# Patient Record
Sex: Male | Born: 1975 | Race: Black or African American | Hispanic: No | Marital: Married | Smoking: Current some day smoker
Health system: Southern US, Community
[De-identification: ages and names within clinical notes are randomized; demographics above are authoritative.]

## PROBLEM LIST (undated history)

## (undated) DIAGNOSIS — G43909 Migraine, unspecified, not intractable, without status migrainosus: Secondary | ICD-10-CM

## (undated) DIAGNOSIS — I10 Essential (primary) hypertension: Secondary | ICD-10-CM

## (undated) HISTORY — PX: SURGERY SCROTAL / TESTICULAR: SUR1316

## (undated) HISTORY — PX: TONSILLECTOMY: SUR1361

## (undated) HISTORY — DX: Migraine, unspecified, not intractable, without status migrainosus: G43.909

## (undated) HISTORY — PX: CHOLECYSTECTOMY: SHX55

## (undated) HISTORY — DX: Essential (primary) hypertension: I10

## (undated) HISTORY — PX: WISDOM TOOTH EXTRACTION: SHX21

---

## 2017-01-13 DIAGNOSIS — I10 Essential (primary) hypertension: Secondary | ICD-10-CM | POA: Diagnosis not present

## 2017-01-13 DIAGNOSIS — G43009 Migraine without aura, not intractable, without status migrainosus: Secondary | ICD-10-CM | POA: Diagnosis not present

## 2017-01-13 DIAGNOSIS — Z6841 Body Mass Index (BMI) 40.0 and over, adult: Secondary | ICD-10-CM | POA: Diagnosis not present

## 2017-01-13 DIAGNOSIS — E782 Mixed hyperlipidemia: Secondary | ICD-10-CM | POA: Diagnosis not present

## 2017-01-13 DIAGNOSIS — Z0000 Encounter for general adult medical examination without abnormal findings: Secondary | ICD-10-CM | POA: Diagnosis not present

## 2017-02-16 ENCOUNTER — Ambulatory Visit: Payer: Self-pay

## 2017-02-16 ENCOUNTER — Other Ambulatory Visit: Payer: Self-pay | Admitting: Podiatry

## 2017-02-16 ENCOUNTER — Ambulatory Visit: Payer: Self-pay | Admitting: Podiatry

## 2017-02-16 ENCOUNTER — Ambulatory Visit (INDEPENDENT_AMBULATORY_CARE_PROVIDER_SITE_OTHER): Payer: Self-pay

## 2017-02-16 VITALS — BP 144/97 | HR 89

## 2017-02-16 DIAGNOSIS — M7751 Other enthesopathy of right foot: Secondary | ICD-10-CM

## 2017-02-16 DIAGNOSIS — R52 Pain, unspecified: Secondary | ICD-10-CM

## 2017-02-16 DIAGNOSIS — M779 Enthesopathy, unspecified: Secondary | ICD-10-CM

## 2017-02-16 DIAGNOSIS — B353 Tinea pedis: Secondary | ICD-10-CM

## 2017-02-16 MED ORDER — KETOCONAZOLE 2 % EX CREA: TOPICAL_CREAM | CUTANEOUS | 0 refills | Status: DC

## 2017-02-16 NOTE — Progress Notes (Signed)
   Subjective:    Patient ID: Marc BoatmanKevin Zeiner, male    DOB: November 22, 1975, 42 y.o.   MRN: [id number]  HPI  Chief Complaint  Patient presents with  . Foot Pain    Right, 3rd 4th and 5th toes        Review of Systems  All other systems reviewed and are negative.      Objective:   Physical Exam        Assessment & Plan:

## 2017-07-10 NOTE — Progress Notes (Signed)
HPI:  Using dictation device. Unfortunately this device frequently misinterprets words/phrases.  Here to establish care.  HTN: -meds: lisinopril-hctz  -does not take meds, daily, did not take today  Morbid obesity: -poor diet, not exercise -prior PCP rxd topamax for this and migraine s- helps some with weight  Migraines: -several per week -improved on topamax, but not great -trigger is stress  Erectile dysfunction: -sometimes, under a lot of stress -no exercise, poor diet  ROS: See pertinent positives and negatives per HPI.  Past Medical History:  Diagnosis Date  . Hypertension   . Migraine     Past Surgical History:  Procedure Laterality Date  . CHOLECYSTECTOMY    . SURGERY SCROTAL / TESTICULAR     age 42 per patient  . TONSILLECTOMY      History reviewed. No pertinent family history.  SOCIAL HX: see hpi occ cigar Social alcohole - small amount a few times per month   Current Outpatient Medications:  .  ketoconazole (NIZORAL) 2 % cream, Apply 1 fingertip amount to each foot daily., Disp: 30 g, Rfl: 0 .  lisinopril-hydrochlorothiazide (PRINZIDE,ZESTORETIC) 20-12.5 MG tablet, Take 1 tablet by mouth daily., Disp: 90 tablet, Rfl: 1 .  topiramate (TOPAMAX) 25 MG tablet, Take 1 tablet (25 mg total) by mouth daily., Disp: 90 tablet, Rfl: 1  EXAM:  Vitals:   07/11/17 0753  BP: 130/90  Pulse: 88  Temp: 98.9 F (37.2 C)    Body mass index is 43 kg/m.  GENERAL: vitals reviewed and listed above, alert, oriented, appears well hydrated and in no acute distress  HEENT: atraumatic, conjunttiva clear, no obvious abnormalities on inspection of external nose and ears  NECK: no obvious masses on inspection  LUNGS: clear to auscultation bilaterally, no wheezes, rales or rhonchi, good air movement  CV: HRRR, no peripheral edema  MS: moves all extremities without noticeable abnormality  PSYCH: pleasant and cooperative, no obvious depression or  anxiety  ASSESSMENT AND PLAN:  Discussed the following assessment and plan:  Essential hypertension - Plan: Basic metabolic panel, CBC  Other migraine without status migrainosus, not intractable - Plan: Ambulatory referral to Neurology  Erectile dysfunction, unspecified erectile dysfunction type  Morbid obesity (HCC) - Plan: HDL cholesterol, Cholesterol, total, Hemoglobin A1c  Encounter to establish care with new doctor  Screening for HIV (human immunodeficiency virus) - Plan: HIV antibody  -BP a little better on recheck, did not take medication today -refills sent -discussed migraines and opted for referral to neurology  -discussed tx options for ED - opted to start with healthier lifestyle and stress management, consider meds -advised healthy diet, regular exercise and wt reduction - see pt instructions -labs per orders -he wanted tetanus booster today -advised 3 month follow up -Patient advised to return or notify a doctor immediately if symptoms worsen or persist or new concerns arise.  Patient Instructions  BEFORE YOU LEAVE: -tetanus booster -labs -follow up: 3 months  -we sent referrals of your medications. Please get a pill box and take medications daily.  -please limit the ibuprofen to no more then 2 x per week.  -consider counseling for stress and acupuncture for migraines  -eat a healthy low sugar diet and get at least 150 minutes of aerobic exercise per week (see details below for a healthy lifestyle).  -We placed a referral for you as discussed to neurology regarding the migraines. It usually takes about 1-2 weeks to process and schedule this referral. If you have not heard from us regarding this  appointment in 2 weeks please contact our office.   We recommend the following healthy lifestyle for LIFE: 1) Small portions. But, make sure to get regular (at least 3 per day), healthy meals and small healthy snacks if needed.  2) Eat a healthy clean diet.   TRY  TO EAT: -at least 5-7 servings of low sugar, colorful, and nutrient rich vegetables per day (not corn, potatoes or bananas.) -berries are the best choice if you wish to eat fruit (only eat small amounts if trying to reduce weight)  -lean meets (fish, white meat of chicken or [place]) -vegan proteins for some meals - beans or tofu, whole grains, nuts and seeds -Replace bad fats with good fats - good fats include: fish, nuts and seeds, canola oil, olive oil -small amounts of low fat or non fat dairy -small amounts of100 % whole grains - check the lables -drink plenty of water  AVOID: -SUGAR, sweets, anything with added sugar, corn syrup or sweeteners - must read labels as even foods advertised as "healthy" often are loaded with sugar -if you must have a sweetener, small amounts of stevia may be best -sweetened beverages and artificially sweetened beverages -simple starches (rice, bread, potatoes, pasta, chips, etc - small amounts of 100% whole grains are ok) -red meat, pork, butter -fried foods, fast food, processed food, excessive dairy, eggs and coconut.  3)Get at least 150 minutes of sweaty aerobic exercise per week.  4)Reduce stress - consider counseling, meditation and relaxation to balance other aspects of your life.    Terressa Koyanagi, DO

## 2017-07-11 ENCOUNTER — Encounter: Payer: Self-pay | Admitting: Family Medicine

## 2017-07-11 ENCOUNTER — Ambulatory Visit: Payer: 59 | Admitting: Family Medicine

## 2017-07-11 ENCOUNTER — Encounter: Payer: Self-pay | Admitting: Neurology

## 2017-07-11 VITALS — BP 130/90 | HR 88 | Temp 98.9°F | Ht 68.0 in | Wt 282.8 lb

## 2017-07-11 DIAGNOSIS — Z114 Encounter for screening for human immunodeficiency virus [HIV]: Secondary | ICD-10-CM

## 2017-07-11 DIAGNOSIS — I10 Essential (primary) hypertension: Secondary | ICD-10-CM | POA: Diagnosis not present

## 2017-07-11 DIAGNOSIS — Z23 Encounter for immunization: Secondary | ICD-10-CM | POA: Diagnosis not present

## 2017-07-11 DIAGNOSIS — N529 Male erectile dysfunction, unspecified: Secondary | ICD-10-CM | POA: Diagnosis not present

## 2017-07-11 DIAGNOSIS — G43809 Other migraine, not intractable, without status migrainosus: Secondary | ICD-10-CM

## 2017-07-11 DIAGNOSIS — G43909 Migraine, unspecified, not intractable, without status migrainosus: Secondary | ICD-10-CM | POA: Insufficient documentation

## 2017-07-11 DIAGNOSIS — Z7689 Persons encountering health services in other specified circumstances: Secondary | ICD-10-CM

## 2017-07-11 LAB — CBC
HEMATOCRIT: 39.6 % (ref 39.0–52.0)
HEMOGLOBIN: 13.4 g/dL (ref 13.0–17.0)
MCHC: 33.7 g/dL (ref 30.0–36.0)
MCV: 91.6 fl (ref 78.0–100.0)
PLATELETS: 307 10*3/uL (ref 150.0–400.0)
RBC: 4.32 Mil/uL (ref 4.22–5.81)
RDW: 13.6 % (ref 11.5–15.5)
WBC: 6.8 10*3/uL (ref 4.0–10.5)

## 2017-07-11 LAB — BASIC METABOLIC PANEL
BUN: 20 mg/dL (ref 6–23)
CHLORIDE: 104 meq/L (ref 96–112)
CO2: 28 meq/L (ref 19–32)
Calcium: 9.7 mg/dL (ref 8.4–10.5)
Creatinine, Ser: 1.07 mg/dL (ref 0.40–1.50)
GFR: 97.38 mL/min (ref >60.00)
Glucose, Bld: 98 mg/dL (ref 70–99)
POTASSIUM: 4.7 meq/L (ref 3.5–5.1)
SODIUM: 139 meq/L (ref 135–145)

## 2017-07-11 LAB — HEMOGLOBIN A1C: HEMOGLOBIN A1C: 5.2 % (ref 4.6–6.5)

## 2017-07-11 LAB — CHOLESTEROL, TOTAL: Cholesterol: 234 mg/dL — ABNORMAL HIGH (ref 0–200)

## 2017-07-11 LAB — HDL CHOLESTEROL: HDL: 37.7 mg/dL — ABNORMAL LOW (ref >39.00)

## 2017-07-11 MED ORDER — LISINOPRIL-HYDROCHLOROTHIAZIDE 20-12.5 MG PO TABS: 1.0000 | ORAL_TABLET | Freq: Every day | ORAL | 1 refills | Status: DC

## 2017-07-11 MED ORDER — TOPIRAMATE 25 MG PO TABS: 25.0000 mg | ORAL_TABLET | Freq: Every day | ORAL | 1 refills | Status: DC

## 2017-07-11 NOTE — Patient Instructions (Signed)
BEFORE YOU LEAVE: -tetanus booster -labs -follow up: 3 months  -we sent referrals of your medications. Please get a pill box and take medications daily.  -please limit the ibuprofen to no more then 2 x per week.  -consider counseling for stress and acupuncture for migraines  -eat a healthy low sugar diet and get at least 150 minutes of aerobic exercise per week (see details below for a healthy lifestyle).  -We placed a referral for you as discussed to neurology regarding the migraines. It usually takes about 1-2 weeks to process and schedule this referral. If you have not heard from us regarding this appointment in 2 weeks please contact our office.   We recommend the following healthy lifestyle for LIFE: 1) Small portions. But, make sure to get regular (at least 3 per day), healthy meals and small healthy snacks if needed.  2) Eat a healthy clean diet.   TRY TO EAT: -at least 5-7 servings of low sugar, colorful, and nutrient rich vegetables per day (not corn, potatoes or bananas.) -berries are the best choice if you wish to eat fruit (only eat small amounts if trying to reduce weight)  -lean meets (fish, white meat of chicken or [place]) -vegan proteins for some meals - beans or tofu, whole grains, nuts and seeds -Replace bad fats with good fats - good fats include: fish, nuts and seeds, canola oil, olive oil -small amounts of low fat or non fat dairy -small amounts of100 % whole grains - check the lables -drink plenty of water  AVOID: -SUGAR, sweets, anything with added sugar, corn syrup or sweeteners - must read labels as even foods advertised as "healthy" often are loaded with sugar -if you must have a sweetener, small amounts of stevia may be best -sweetened beverages and artificially sweetened beverages -simple starches (rice, bread, potatoes, pasta, chips, etc - small amounts of 100% whole grains are ok) -red meat, pork, butter -fried foods, fast food, processed food,  excessive dairy, eggs and coconut.  3)Get at least 150 minutes of sweaty aerobic exercise per week.  4)Reduce stress - consider counseling, meditation and relaxation to balance other aspects of your life.

## 2017-07-11 NOTE — Addendum Note (Signed)
Addended by: Johnella MoloneyFUNDERBURK, JO A on: 07/11/2017 08:43 AM   Modules accepted: Orders

## 2017-07-12 LAB — HIV ANTIBODY (ROUTINE TESTING W REFLEX): HIV: NONREACTIVE

## 2017-07-17 ENCOUNTER — Encounter: Payer: Self-pay | Admitting: Family Medicine

## 2017-07-17 DIAGNOSIS — E785 Hyperlipidemia, unspecified: Secondary | ICD-10-CM | POA: Insufficient documentation

## 2017-09-13 NOTE — Progress Notes (Signed)
NEUROLOGY CONSULTATION NOTE  Marc Bradley MRN: [id number] DOB: Oct 20, 1975  Referring provider: Kriste Basque, DO Primary care provider: Kriste Basque, DO  Reason for consult:  migraines  HISTORY OF PRESENT ILLNESS: Marc Bradley is a 42 year old right-handed male with hypertension and migraines who presents for migraines.  History supplemented by referring provider's note.  Onset:  87 or 42 years old Location:  Crown/top of head Quality:  Pounding/squeezing/throbbing Intensity:  Severe.  He denies new headache, thunderclap headache or severe headache that wakes him from sleep. Aura:  He sees stars with the headache Prodrome:  no Postdrome:  no Associated symptoms:  Photophobia, phonophobia, osmophobia, sometimes nausea.  He denies associated unilateral numbness or weakness. Duration:  2 to 3 hours Frequency:  2 to 3 days a week Frequency of abortive medication: Takes Motrin 2 to 3 days a week Triggers:  No Exacerbating factors:  No Relieving factors:  Dark quiet room Activity:  aggravates  Current NSAIDS:  Motrin 800mg  Current analgesics:  no Current triptans:  no Current ergotamine:  no Current anti-emetic:  no Current muscle relaxants:  no Current anti-anxiolytic:  no Current sleep aide:  no Current Antihypertensive medications:  Lisinopril-HCTZ Current Antidepressant medications:  no Current Anticonvulsant medications:  topiramate 50mg  twice daily Current anti-CGRP:  no Current Vitamins/Herbal/Supplements:  no Current Antihistamines/Decongestants:  no Other therapy:   no  Past NSAIDS:  No Past analgesics:  Tylenol (ineffective), Excedrin (ineffective) Past abortive triptans:  no Past abortive ergotamine:  no Past muscle relaxants:  no Past anti-emetic:  no Past antihypertensive medications:  no Past antidepressant medications:  no Past anticonvulsant medications:  no Past anti-CGRP:  no Past vitamins/Herbal/Supplements:  no Past  antihistamines/decongestants:  no Other past therapies:  no  Caffeine:  2-3 sodas daily Alcohol:  social Diet:  Drinks 48 oz a day or more of water Exercise:  Not routine Depression:  no; Anxiety:  sometimes Other pain:  no Sleep hygiene: good Family history of headache: Sister may have migraines  07/11/17 LABS:  CBC with WBC 6.8, HGB 13.4, HCT 39.6, PLT 307; BMP with Na 139, K 4.7, Cl 104, CO2 28, glucose 98, BUN 20, Cr 1.07.  PAST MEDICAL HISTORY: Past Medical History:  Diagnosis Date  . Hypertension   . Migraine     PAST SURGICAL HISTORY: Past Surgical History:  Procedure Laterality Date  . CHOLECYSTECTOMY    . SURGERY SCROTAL / TESTICULAR     age 36 per patient  . TONSILLECTOMY      MEDICATIONS: Current Outpatient Medications on File Prior to Visit  Medication Sig Dispense Refill  . ketoconazole (NIZORAL) 2 % cream Apply 1 fingertip amount to each foot daily. 30 g 0  . lisinopril-hydrochlorothiazide (PRINZIDE,ZESTORETIC) 20-12.5 MG tablet Take 1 tablet by mouth daily. 90 tablet 1  . topiramate (TOPAMAX) 25 MG tablet Take 1 tablet (25 mg total) by mouth daily. 90 tablet 1   No current facility-administered medications on file prior to visit.     ALLERGIES: No Known Allergies  FAMILY HISTORY: No family history on file.  SOCIAL HISTORY: Social History   Socioeconomic History  . Marital status: Married    Spouse name: Not on file  . Number of children: Not on file  . Years of education: Not on file  . Highest education level: Not on file  Occupational History  . Not on file  Social Needs  . Financial resource strain: Not on file  . Food insecurity:    Worry:  Not on file    Inability: Not on file  . Transportation needs:    Medical: Not on file    Non-medical: Not on file  Tobacco Use  . Smoking status: Current Some Day Smoker    Types: Cigars  . Smokeless tobacco: Former Engineer, water and Sexual Activity  . Alcohol use: Yes    Comment: social  drinker  . Drug use: Never  . Sexual activity: Not on file  Lifestyle  . Physical activity:    Days per week: Not on file    Minutes per session: Not on file  . Stress: Not on file  Relationships  . Social connections:    Talks on phone: Not on file    Gets together: Not on file    Attends religious service: Not on file    Active member of club or organization: Not on file    Attends meetings of clubs or organizations: Not on file    Relationship status: Not on file  . Intimate partner violence:    Fear of current or ex partner: Not on file    Emotionally abused: Not on file    Physically abused: Not on file    Forced sexual activity: Not on file  Other Topics Concern  . Not on file  Social History Narrative  . Not on file    REVIEW OF SYSTEMS: Constitutional: No fevers, chills, or sweats, no generalized fatigue, change in appetite Eyes: No visual changes, double vision, eye pain Ear, nose and throat: No hearing loss, ear pain, nasal congestion, sore throat Cardiovascular: No chest pain, palpitations Respiratory:  No shortness of breath at rest or with exertion, wheezes GastrointestinaI: No nausea, vomiting, diarrhea, abdominal pain, fecal incontinence Genitourinary:  No dysuria, urinary retention or frequency Musculoskeletal:  No neck pain, back pain Integumentary: No rash, pruritus, skin lesions Neurological: as above Psychiatric: No depression, insomnia, anxiety Endocrine: No palpitations, fatigue, diaphoresis, mood swings, change in appetite, change in weight, increased thirst Hematologic/Lymphatic:  No purpura, petechiae. Allergic/Immunologic: no itchy/runny eyes, nasal congestion, recent allergic reactions, rashes  PHYSICAL EXAM: Blood pressure (!) 164/110, pulse 86, height 5\' 7"  (1.702 m), weight 286 lb (129.7 kg), SpO2 98 %. General: No acute distress.  Patient appears well-groomed.   Head:  Normocephalic/atraumatic Eyes:  fundi examined but not  visualized Neck: supple, no paraspinal tenderness, full range of motion Back: No paraspinal tenderness Heart: regular rate and rhythm Lungs: Clear to auscultation bilaterally. Vascular: No carotid bruits. Neurological Exam: Mental status: alert and oriented to person, place, and time, recent and remote memory intact, fund of knowledge intact, attention and concentration intact, speech fluent and not dysarthric, language intact. Cranial nerves: CN I: not tested CN II: pupils equal, round and reactive to light, visual fields intact CN III, IV, VI:  full range of motion, no nystagmus, no ptosis CN V: facial sensation intact CN VII: upper and lower face symmetric CN VIII: hearing intact CN IX, X: gag intact, uvula midline CN XI: sternocleidomastoid and trapezius muscles intact CN XII: tongue midline Bulk & Tone: normal, no fasciculations. Motor:  5/5 throughout  Sensation:  temperature and vibration sensation intact.   Deep Tendon Reflexes:  2+ throughout, toes downgoing.  Finger to nose testing:  Without dysmetria.   Heel to shin:  Without dysmetria.   Gait:  Normal station and stride.  Able to turn and tandem walk. Romberg negative.  IMPRESSION: Migraine with aura, without status migrainosus, not intractable Hypertension:  Elevated today.  He did not take his medication this morning  PLAN: 1.  For abortive therapy, will start sumatriptan 100mg  2.  Increase topiramate to 75mg  twice daily.  If migraines not improved in 6 weeks, he is to contact me and we can increase dose to 100mg  twice daily 3.  Limit use of pain relievers to no more than 2 days out of week to prevent risk of rebound or medication-overuse headache. 4.  Keep headache diary 5.  Advised to go home ant take blood pressure medication.  Recheck blood pressure later today and if still elevated should contact Dr. Selena Batten. 6.  Follow up in 4 months.  Thank you for allowing me to take part in the care of this patient.  Shon Millet, DO  CC: Kriste Basque, DO

## 2017-09-14 ENCOUNTER — Encounter: Payer: Self-pay | Admitting: Neurology

## 2017-09-14 ENCOUNTER — Ambulatory Visit: Payer: 59 | Admitting: Neurology

## 2017-09-14 VITALS — BP 164/110 | HR 86 | Ht 67.0 in | Wt 286.0 lb

## 2017-09-14 DIAGNOSIS — G43109 Migraine with aura, not intractable, without status migrainosus: Secondary | ICD-10-CM

## 2017-09-14 DIAGNOSIS — I10 Essential (primary) hypertension: Secondary | ICD-10-CM

## 2017-09-14 MED ORDER — SUMATRIPTAN SUCCINATE 100 MG PO TABS: ORAL_TABLET | ORAL | 3 refills | Status: DC

## 2017-09-14 MED ORDER — TOPIRAMATE 50 MG PO TABS: 75.0000 mg | ORAL_TABLET | Freq: Two times a day (BID) | ORAL | 3 refills | Status: DC

## 2017-09-14 NOTE — Patient Instructions (Signed)
Migraine Recommendations: 1.  I will prescribe topiramate 50mg  tablet.  Take 1 1/2 tablets twice daily.  Contact me in 6 weeks with update and we can adjust dose if needed. 2.  Take sumatriptan 100mg  at earliest onset of headache.  May repeat dose once in 2 hours if needed.  Do not exceed two tablets in 24 hours. 3.  Limit use of pain relievers to no more than 2 days out of the week.  These medications include acetaminophen, ibuprofen, triptans and narcotics.  This will help reduce risk of rebound headaches. 4.  Be aware of common food triggers such as processed sweets, processed foods with nitrites (such as deli meat, hot dogs, sausages), foods with MSG, alcohol (such as wine), chocolate, certain cheeses, certain fruits (dried fruits, bananas, some citrus fruit), vinegar, diet soda. 4.  Avoid caffeine 5.  Routine exercise 6.  Proper sleep hygiene 7.  Stay adequately hydrated with water 8.  Keep a headache diary. 9.  Maintain proper stress management. 10.  Do not skip meals. 11.  Consider supplements:  Magnesium citrate 400mg  to 600mg  daily, riboflavin 400mg , Coenzyme Q 10 100mg  three times daily 12.  Take your blood pressure medication as soon as you get home.  Check your blood pressure later and if still elevated, contact Dr. Selena Batten 13.  Follow up in 4 months.

## 2017-09-20 NOTE — Progress Notes (Signed)
  Subjective:  Patient ID: Marc Bradley, male    DOB: September 19, 1975,  MRN: [id number]  Chief Complaint  Patient presents with  . Foot Pain    Right, 3rd 4th and 5th toes    42 y.o. male presents with the above complaint.  Reports that the skin is peeling between the toes.  Also reports that the third fourth fifth toes hurt for the last 3 weeks.   Review of Systems: Negative except as noted in the HPI. Denies N/V/F/Ch.  Past Medical History:  Diagnosis Date  . Hypertension   . Migraine     Current Outpatient Medications:  .  ketoconazole (NIZORAL) 2 % cream, Apply 1 fingertip amount to each foot daily. (Patient not taking: Reported on 09/14/2017), Disp: 30 g, Rfl: 0 .  lisinopril-hydrochlorothiazide (PRINZIDE,ZESTORETIC) 20-12.5 MG tablet, Take 1 tablet by mouth daily., Disp: 90 tablet, Rfl: 1 .  SUMAtriptan (IMITREX) 100 MG tablet, Take 1 tablet earliest onset of migraine.  May repeat x1 in 2 hours if headache persists or recurs.  Do not exceed 2 tablets in 24 h, Disp: 10 tablet, Rfl: 3 .  topiramate (TOPAMAX) 50 MG tablet, Take 1.5 tablets (75 mg total) by mouth 2 (two) times daily., Disp: 90 tablet, Rfl: 3  Social History   Tobacco Use  Smoking Status Current Some Day Smoker  . Types: Cigars  Smokeless Tobacco Former User    No Known Allergies Objective:   Vitals:   02/16/17 0908  BP: (!) 144/97  Pulse: 89   There is no height or weight on file to calculate BMI. Constitutional Well developed. Well nourished.  Vascular Dorsalis pedis pulses palpable bilaterally. Posterior tibial pulses palpable bilaterally. Capillary refill normal to all digits.  No cyanosis or clubbing noted. Pedal hair growth normal.  Neurologic Normal speech. Oriented to person, place, and time. Epicritic sensation to light touch grossly present bilaterally.  Dermatologic Nails well groomed and normal in appearance. No open wounds. No skin lesions. Macerated interspaces Xerosis was  going to the plantar foot  Orthopedic: Normal joint ROM without pain or crepitus bilaterally. No visible deformities. No bony tenderness.   Radiographs: None Assessment:   1. Tinea pedis of both feet    Plan:  Patient was evaluated and treated and all questions answered.  Tinea pedis -Rx Ketoconazole -Educated on proper foot hygiene.  Return in about 5 weeks (around 03/23/2017) for Fungus f/u.

## 2017-10-12 ENCOUNTER — Ambulatory Visit: Payer: 59 | Admitting: Family Medicine

## 2017-10-23 DIAGNOSIS — Z0100 Encounter for examination of eyes and vision without abnormal findings: Secondary | ICD-10-CM | POA: Diagnosis not present

## 2017-12-04 ENCOUNTER — Ambulatory Visit: Payer: 59 | Admitting: Family Medicine

## 2018-02-09 ENCOUNTER — Encounter (HOSPITAL_COMMUNITY): Payer: Self-pay

## 2018-02-09 ENCOUNTER — Ambulatory Visit (HOSPITAL_COMMUNITY)
Admission: EM | Admit: 2018-02-09 | Discharge: 2018-02-09 | Disposition: A | Discharge status: Home / Self Care | Payer: 59 | Attending: Internal Medicine | Admitting: Internal Medicine

## 2018-02-09 DIAGNOSIS — J069 Acute upper respiratory infection, unspecified: Secondary | ICD-10-CM | POA: Diagnosis not present

## 2018-02-09 LAB — POCT RAPID STREP A: Streptococcus, Group A Screen (Direct): NEGATIVE

## 2018-02-09 NOTE — ED Notes (Signed)
Patient able to ambulate independently  

## 2018-02-09 NOTE — Discharge Instructions (Addendum)
I believe this is a viral upper respiratory infection. The rapid strep test was negative You can take ibuprofen for the pain You can do Chloraseptic spray or Cepacol lozenges for throat discomfort Warm salt water gargles Mucinex DM for cough and congestion Follow up as needed for continued or worsening symptoms

## 2018-02-09 NOTE — ED Triage Notes (Signed)
Pt presents with sore throat X 2 days. 

## 2018-02-09 NOTE — ED Provider Notes (Signed)
[facility]    CSN: [id number] Arrival date & time: 02/09/18  1343     History   Chief Complaint Chief Complaint  Patient presents with  . Sore Throat    HPI Marc Bradley is a 43 y.o. male.   Pt is a 43 year old male that presents with 2 days of sore throat.  Symptoms have been constant and worsening.  He is also had associated cough, congestion, body aches and myalgias.  He has been taking over-the-counter medication without much relief of symptoms.  No recent sick contacts.  No recent traveling.  ROS per HPI      Past Medical History:  Diagnosis Date  . Hypertension   . Migraine     Patient Active Problem List   Diagnosis Date Noted  . Hyperlipidemia 07/17/2017  . Essential hypertension 07/11/2017  . Migraine 07/11/2017  . Erectile dysfunction 07/11/2017  . Morbid obesity (HCC) 07/11/2017    Past Surgical History:  Procedure Laterality Date  . CHOLECYSTECTOMY    . SURGERY SCROTAL / TESTICULAR     age 84 per patient  . TONSILLECTOMY         Home Medications    Prior to Admission medications   Medication Sig Start Date End Date Taking? Authorizing Provider  ketoconazole (NIZORAL) 2 % cream Apply 1 fingertip amount to each foot daily. Patient not taking: Reported on 09/14/2017 02/16/17   Park Liter, DPM  lisinopril-hydrochlorothiazide (PRINZIDE,ZESTORETIC) 20-12.5 MG tablet Take 1 tablet by mouth daily. 07/11/17   Terressa Koyanagi, DO  SUMAtriptan (IMITREX) 100 MG tablet Take 1 tablet earliest onset of migraine.  May repeat x1 in 2 hours if headache persists or recurs.  Do not exceed 2 tablets in 24 h 09/14/17   Everlena Cooper, Adam R, DO  topiramate (TOPAMAX) 50 MG tablet Take 1.5 tablets (75 mg total) by mouth 2 (two) times daily. 09/14/17   Drema Dallas, DO    Family History Family History  Problem Relation Age of Onset  . Diabetes Sister     Social History Social History   Tobacco Use  . Smoking status: Current Some Day Smoker   Types: Cigars  . Smokeless tobacco: Former Engineer, water Use Topics  . Alcohol use: Yes    Comment: social drinker, wine beed and cocktails  . Drug use: Never     Allergies   Patient has no known allergies.   Review of Systems Review of Systems   Physical Exam Triage Vital Signs ED Triage Vitals  Enc Vitals Group     BP 02/09/18 1518 (!) 135/94     Pulse Rate 02/09/18 1518 92     Resp 02/09/18 1518 20     Temp 02/09/18 1518 98.4 F (36.9 C)     Temp Source 02/09/18 1518 Oral     SpO2 02/09/18 1518 98 %     Weight --      Height --      Head Circumference --      Peak Flow --      Pain Score 02/09/18 1520 8     Pain Loc --      Pain Edu? --      Excl. in GC? --    No data found.  Updated Vital Signs BP (!) 135/94 (BP Location: Left Arm)   Pulse 92   Temp 98.4 F (36.9 C) (Oral)   Resp 20   SpO2 98%   Visual Acuity Right Eye  Distance:   Left Eye Distance:   Bilateral Distance:    Right Eye Near:   Left Eye Near:    Bilateral Near:     Physical Exam Vitals signs and nursing note reviewed.  Constitutional:      Appearance: He is well-developed. He is not ill-appearing or toxic-appearing.  HENT:     Head: Normocephalic and atraumatic.     Right Ear: Tympanic membrane and ear canal normal.     Left Ear: Tympanic membrane and ear canal normal.     Nose: Congestion and rhinorrhea present.     Mouth/Throat:     Pharynx: Posterior oropharyngeal erythema present.     Tonsils: Swelling: 1+ on the right. 1+ on the left.  Eyes:     Conjunctiva/sclera: Conjunctivae normal.  Neck:     Musculoskeletal: Neck supple.  Cardiovascular:     Rate and Rhythm: Normal rate and regular rhythm.     Heart sounds: No murmur.  Pulmonary:     Effort: Pulmonary effort is normal. No respiratory distress.     Breath sounds: Normal breath sounds.  Abdominal:     Palpations: Abdomen is soft.     Tenderness: There is no abdominal tenderness.  Skin:    General: Skin is  warm and dry.  Neurological:     Mental Status: He is alert.  Psychiatric:        Mood and Affect: Mood normal.      UC Treatments / Results  Labs (all labs ordered are listed, but only abnormal results are displayed) Labs Reviewed  CULTURE, GROUP A STREP [facility])  POCT RAPID STREP A    EKG None  Radiology No results found.  Procedures Procedures (including critical care time)  Medications Ordered in UC Medications - No data to display  Initial Impression / Assessment and Plan / UC Course  I have reviewed the triage vital signs and the nursing notes.  Pertinent labs & imaging results that were available during my care of the patient were reviewed by me and considered in my medical decision making (see chart for details).     Rapid strep test negative this is most likely a viral upper respiratory infection Treating with over-the-counter medication for symptomatic relief Follow up as needed for continued or worsening symptoms  Final Clinical Impressions(s) / UC Diagnoses   Final diagnoses:  Viral upper respiratory tract infection     Discharge Instructions     I believe this is a viral upper respiratory infection. The rapid strep test was negative You can take ibuprofen for the pain You can do Chloraseptic spray or Cepacol lozenges for throat discomfort Warm salt water gargles Mucinex DM for cough and congestion Follow up as needed for continued or worsening symptoms      ED Prescriptions    None     Controlled Substance Prescriptions [place] Controlled Substance Registry consulted? no   Janace Aris, NP 02/11/18 1252

## 2018-02-12 LAB — CULTURE, GROUP A STREP (THRC)

## 2018-03-14 NOTE — Progress Notes (Deleted)
NEUROLOGY FOLLOW UP OFFICE NOTE  Marc Bradley [id number]  HISTORY OF PRESENT ILLNESS: Marc Bradley is a 43 year old right-handed man with hypertension and migraines who follows up for migraines.  UPDATE: Intensity:  *** Duration:  *** Frequency:  *** Frequency of abortive medication: *** Current NSAIDS:  Motrin 800mg  Current analgesics:  no Current triptans:   Sumatriptan 100 mg Current ergotamine:  no Current anti-emetic:  no Current muscle relaxants:  no Current anti-anxiolytic:  no Current sleep aide:  no Current Antihypertensive medications:  Lisinopril-HCTZ Current Antidepressant medications:  no Current Anticonvulsant medications:  topiramate  75mg  twice daily Current anti-CGRP:  no Current Vitamins/Herbal/Supplements:  no Current Antihistamines/Decongestants:  no Other therapy:   no  Caffeine:  2-3 sodas daily Alcohol:  social Diet:  Drinks 48 oz a day or more of water Exercise:  Not routine Depression:  no; Anxiety:  sometimes Other pain:  no Sleep hygiene: good  HISTORY:  Onset: Age 24 or 9 Location:  Crown/top of head Quality:  Pounding/squeezing/throbbing Initial intensity:  Severe.  He denies new headache, thunderclap headache or severe headache that wakes him from sleep. Aura:  He sees stars with the headache Prodrome:  no Postdrome:  no Associated symptoms: Photophobia, phonophobia, osmophobia, sometimes nausea.  He denies associated unilateral numbness or weakness. Initial duration:  2 to 3 hours Initial Frequency:  2 to 3 days a week Initial Frequency of abortive medication: Takes Motrin 2 to 3 days a week Triggers: No Relieving factors: Dark quiet room Activity:  aggravates  Past NSAIDS:  No Past analgesics:  Tylenol (ineffective), Excedrin (ineffective) Past abortive triptans:  no Past abortive ergotamine:  no Past muscle relaxants:  no Past anti-emetic:  no Past antihypertensive medications:  no Past antidepressant  medications:  no Past anticonvulsant medications:  no Past anti-CGRP:  no Past vitamins/Herbal/Supplements:  no Past antihistamines/decongestants:  no Other past therapies:  no  Family history of headache: Sister may have migraines  PAST MEDICAL HISTORY: Past Medical History:  Diagnosis Date  . Hypertension   . Migraine     MEDICATIONS: Current Outpatient Medications on File Prior to Visit  Medication Sig Dispense Refill  . ketoconazole (NIZORAL) 2 % cream Apply 1 fingertip amount to each foot daily. (Patient not taking: Reported on 09/14/2017) 30 g 0  . lisinopril-hydrochlorothiazide (PRINZIDE,ZESTORETIC) 20-12.5 MG tablet Take 1 tablet by mouth daily. 90 tablet 1  . SUMAtriptan (IMITREX) 100 MG tablet Take 1 tablet earliest onset of migraine.  May repeat x1 in 2 hours if headache persists or recurs.  Do not exceed 2 tablets in 24 h 10 tablet 3  . topiramate (TOPAMAX) 50 MG tablet Take 1.5 tablets (75 mg total) by mouth 2 (two) times daily. 90 tablet 3   No current facility-administered medications on file prior to visit.     ALLERGIES: No Known Allergies  FAMILY HISTORY: Family History  Problem Relation Age of Onset  . Diabetes Sister    SOCIAL HISTORY: Social History   Socioeconomic History  . Marital status: Married    Spouse name: Tiffany  . Number of children: 3  . Years of education: Not on file  . Highest education level: Master's degree (e.g., MA, MS, MEng, MEd, MSW, MBA)  Occupational History  . Occupation: Set designer: [employer]  Social Needs  . Financial resource strain: Not on file  . Food insecurity:    Worry: Not on file    Inability: Not on file  . Transportation  needs:    Medical: Not on file    Non-medical: Not on file  Tobacco Use  . Smoking status: Current Some Day Smoker    Types: Cigars  . Smokeless tobacco: Former Engineer, water and Sexual Activity  . Alcohol use: Yes    Comment: social drinker, wine beed and  cocktails  . Drug use: Never  . Sexual activity: Not on file  Lifestyle  . Physical activity:    Days per week: Not on file    Minutes per session: Not on file  . Stress: Not on file  Relationships  . Social connections:    Talks on phone: Not on file    Gets together: Not on file    Attends religious service: Not on file    Active member of club or organization: Not on file    Attends meetings of clubs or organizations: Not on file    Relationship status: Not on file  . Intimate partner violence:    Fear of current or ex partner: Not on file    Emotionally abused: Not on file    Physically abused: Not on file    Forced sexual activity: Not on file  Other Topics Concern  . Not on file  Social History Narrative   Patient is right-handed. 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 Drinks 2-3 sodas a day. He does not exercise.    REVIEW OF SYSTEMS: Constitutional: No fevers, chills, or sweats, no generalized fatigue, change in appetite Eyes: No visual changes, double vision, eye pain Ear, nose and throat: No hearing loss, ear pain, nasal congestion, sore throat Cardiovascular: No chest pain, palpitations Respiratory:  No shortness of breath at rest or with exertion, wheezes GastrointestinaI: No nausea, vomiting, diarrhea, abdominal pain, fecal incontinence Genitourinary:  No dysuria, urinary retention or frequency Musculoskeletal:  No neck pain, back pain Integumentary: No rash, pruritus, skin lesions Neurological: as above Psychiatric: No depression, insomnia, anxiety Endocrine: No palpitations, fatigue, diaphoresis, mood swings, change in appetite, change in weight, increased thirst Hematologic/Lymphatic:  No purpura, petechiae. Allergic/Immunologic: no itchy/runny eyes, nasal congestion, recent allergic reactions, rashes  PHYSICAL EXAM: *** General: No acute distress.  Patient appears ***-groomed.  *** body habitus. Head:   Normocephalic/atraumatic Eyes:  Fundi examined but not visualized Neck: supple, no paraspinal tenderness, full range of motion Heart:  Regular rate and rhythm Lungs:  Clear to auscultation bilaterally Back: No paraspinal tenderness Neurological Exam: alert and oriented to person, place, and time. Attention span and concentration intact, recent and remote memory intact, fund of knowledge intact.  Speech fluent and not dysarthric, language intact.  CN II-XII intact. Bulk and tone normal, muscle strength 5/5 throughout.  Sensation to light touch intact.  Deep tendon reflexes 2+ throughout, toes downgoing.  Finger to nose and heel to shin testing intact.  Gait normal, Romberg negative.  IMPRESSION: Migraine with aura, without status migrainosus, not intractable  PLAN: 1.  For preventative management, *** 2.  For abortive therapy, *** 3.  Limit use of pain relievers to no more than 2 days out of week to prevent risk of rebound or medication-overuse headache. 4.  Keep headache diary 5.  Exercise, hydration, caffeine cessation, sleep hygiene, monitor for and avoid triggers 6.  Consider:  magnesium citrate 400mg  daily, riboflavin 400mg  daily, and coenzyme Q10 100mg  three times daily 7.  Follow up ***  Shon Millet, DO  CC: Kriste Basque, DO

## 2018-03-16 ENCOUNTER — Ambulatory Visit: Payer: 59 | Admitting: Neurology

## 2018-04-17 ENCOUNTER — Encounter: Payer: Self-pay | Admitting: Neurology

## 2018-04-24 ENCOUNTER — Ambulatory Visit: Payer: 59 | Admitting: Neurology

## 2018-05-04 ENCOUNTER — Other Ambulatory Visit: Payer: Self-pay | Admitting: Family Medicine

## 2018-08-08 ENCOUNTER — Other Ambulatory Visit: Payer: Self-pay

## 2018-08-08 DIAGNOSIS — Z20822 Contact with and (suspected) exposure to covid-19: Secondary | ICD-10-CM

## 2018-08-09 LAB — NOVEL CORONAVIRUS, NAA: SARS-CoV-2, NAA: NOT DETECTED

## 2020-06-29 ENCOUNTER — Other Ambulatory Visit: Payer: Self-pay

## 2020-06-29 ENCOUNTER — Encounter: Payer: Self-pay | Admitting: Internal Medicine

## 2020-06-29 ENCOUNTER — Ambulatory Visit: Payer: BC Managed Care – PPO | Admitting: Internal Medicine

## 2020-06-29 VITALS — BP 170/96 | HR 77 | Temp 98.3°F | Resp 16 | Ht 67.0 in | Wt 304.0 lb

## 2020-06-29 DIAGNOSIS — Z9989 Dependence on other enabling machines and devices: Secondary | ICD-10-CM

## 2020-06-29 DIAGNOSIS — G4733 Obstructive sleep apnea (adult) (pediatric): Secondary | ICD-10-CM | POA: Diagnosis not present

## 2020-06-29 DIAGNOSIS — I10 Essential (primary) hypertension: Secondary | ICD-10-CM | POA: Diagnosis not present

## 2020-06-29 DIAGNOSIS — E559 Vitamin D deficiency, unspecified: Secondary | ICD-10-CM

## 2020-06-29 DIAGNOSIS — R519 Headache, unspecified: Secondary | ICD-10-CM | POA: Diagnosis not present

## 2020-06-29 DIAGNOSIS — D539 Nutritional anemia, unspecified: Secondary | ICD-10-CM

## 2020-06-29 DIAGNOSIS — R9431 Abnormal electrocardiogram [ECG] [EKG]: Secondary | ICD-10-CM

## 2020-06-29 DIAGNOSIS — R0600 Dyspnea, unspecified: Secondary | ICD-10-CM | POA: Diagnosis not present

## 2020-06-29 DIAGNOSIS — Z0001 Encounter for general adult medical examination with abnormal findings: Secondary | ICD-10-CM | POA: Diagnosis not present

## 2020-06-29 DIAGNOSIS — N5201 Erectile dysfunction due to arterial insufficiency: Secondary | ICD-10-CM | POA: Insufficient documentation

## 2020-06-29 DIAGNOSIS — E785 Hyperlipidemia, unspecified: Secondary | ICD-10-CM | POA: Diagnosis not present

## 2020-06-29 DIAGNOSIS — R0609 Other forms of dyspnea: Secondary | ICD-10-CM | POA: Insufficient documentation

## 2020-06-29 DIAGNOSIS — E291 Testicular hypofunction: Secondary | ICD-10-CM

## 2020-06-29 NOTE — Progress Notes (Signed)
Abnormal ekg   Subjective:  Patient ID: Marc Bradley, male    DOB: 02-08-1975  Age: 45 y.o. MRN: [id number]  CC: Annual Exam and Hypertension  This visit occurred during the SARS-CoV-2 public health emergency.  Safety protocols were in place, including screening questions prior to the visit, additional usage of staff PPE, and extensive cleaning of exam room while observing appropriate contact time as indicated for disinfecting solutions.    HPI Marc Bradley presents for a CPX, f/up, and to establish.  He complains of a chronic intermittent headache.  He describes it as a stabbing sensation in the top/back of his head.  He was previously treated for migraines with sumatriptan and Topamax.  He stopped taking those because they were not effective.  He has been trying to control the pain with BC powders and Motrin.  He has sleep apnea.  He complains of a 22-month history of dyspnea on exertion.  He denies chest pain, diaphoresis, dizziness, or lightheadedness.  He also complains of erectile dysfunction.  He has a history of hypertension and is currently taking an ACE inhibitor/thiazide diuretic combination.  History Marc Bradley has a past medical history of Hypertension, Migraine, and Migraines.   He has a past surgical history that includes Tonsillectomy; Cholecystectomy; Surgery scrotal / testicular; and Wisdom tooth extraction.   His family history includes Cancer in his father and maternal grandmother; Diabetes in his sister; Hypertension in his father.He reports that he has been smoking cigars. He has quit using smokeless tobacco. He reports current alcohol use. He reports that he does not use drugs.  Outpatient Medications Prior to Visit  Medication Sig Dispense Refill   ketoconazole (NIZORAL) 2 % cream Apply 1 fingertip amount to each foot daily. 30 g 0   lisinopril-hydrochlorothiazide (ZESTORETIC) 20-12.5 MG tablet TAKE 1 TABLET BY MOUTH EVERY DAY 90 tablet 0   SUMAtriptan  (IMITREX) 100 MG tablet Take 1 tablet earliest onset of migraine.  May repeat x1 in 2 hours if headache persists or recurs.  Do not exceed 2 tablets in 24 h (Patient not taking: Reported on 06/29/2020) 10 tablet 3   topiramate (TOPAMAX) 50 MG tablet Take 1.5 tablets (75 mg total) by mouth 2 (two) times daily. (Patient not taking: Reported on 06/29/2020) 90 tablet 3   No facility-administered medications prior to visit.    ROS Review of Systems  Constitutional:  Positive for fatigue and unexpected weight change (wt gain). Negative for appetite change, chills and diaphoresis.  HENT: Negative.    Eyes: Negative.  Negative for visual disturbance.  Respiratory:  Positive for apnea and shortness of breath (DOE). Negative for cough and wheezing.   Cardiovascular:  Negative for chest pain, palpitations and leg swelling.  Gastrointestinal:  Negative for abdominal pain, constipation, diarrhea, nausea and vomiting.  Genitourinary: Negative.  Negative for difficulty urinating, scrotal swelling and testicular pain.  Musculoskeletal: Negative.   Skin: Negative.   Neurological:  Positive for headaches. Negative for dizziness, weakness and light-headedness.  Hematological:  Negative for adenopathy. Does not bruise/bleed easily.  Psychiatric/Behavioral: Negative.     Objective:  BP (!) 170/96 (BP Location: Right Arm, Patient Position: Sitting, Cuff Size: Large)   Pulse 77   Temp 98.3 F (36.8 C) (Oral)   Resp 16   Ht 5\' 7"  (1.702 m)   Wt (!) 304 lb (137.9 kg)   SpO2 96%   BMI 47.61 kg/m   Physical Exam Constitutional:      Appearance: He is obese.  HENT:  Mouth/Throat:     Mouth: Mucous membranes are moist.  Eyes:     General: No scleral icterus.    Conjunctiva/sclera: Conjunctivae normal.  Cardiovascular:     Rate and Rhythm: Normal rate and regular rhythm.     Heart sounds: No murmur heard.   No gallop.     Comments: EKG- NSR, 76 bpm Incomplete RBBB ++artifact ?LVH No Q  waves Flat/inverted T waves in inf/lateral leads Pulmonary:     Effort: Pulmonary effort is normal.     Breath sounds: No stridor. No wheezing, rhonchi or rales.  Abdominal:     General: Abdomen is protuberant. Bowel sounds are normal. There is no distension.     Palpations: Abdomen is soft. There is no hepatomegaly, splenomegaly or mass.     Tenderness: There is no abdominal tenderness.     Hernia: No hernia is present.  Musculoskeletal:        General: Normal range of motion.     Cervical back: Neck supple.     Right lower leg: 1+ Pitting Edema present.     Left lower leg: 1+ Pitting Edema present.  Lymphadenopathy:     Cervical: No cervical adenopathy.  Skin:    General: Skin is warm and dry.     Coloration: Skin is not pale.  Neurological:     General: No focal deficit present.     Mental Status: He is alert.  Psychiatric:        Mood and Affect: Mood normal.        Behavior: Behavior normal.    Lab Results  Component Value Date   WBC 6.4 06/29/2020   HGB 12.7 (L) 06/29/2020   HCT 37.9 (L) 06/29/2020   PLT 299.0 06/29/2020   GLUCOSE 82 06/29/2020   CHOL 206 (H) 06/29/2020   TRIG 238.0 (H) 06/29/2020   HDL 35.50 (L) 06/29/2020   LDLDIRECT 153.0 06/29/2020   ALT 20 06/29/2020   AST 17 06/29/2020   NA 138 06/29/2020   K 4.1 06/29/2020   CL 102 06/29/2020   CREATININE 1.35 06/29/2020   BUN 17 06/29/2020   CO2 29 06/29/2020   TSH 2.24 06/29/2020   PSA 1.15 06/29/2020   HGBA1C 5.2 06/29/2020     Assessment & Plan:   Marc Bradley was seen today for annual exam and hypertension.  Diagnoses and all orders for this visit:  DOE (dyspnea on exertion)- Labs are reassuring.  His EKG is abnormal so I have asked him to see cardiology to have this evaluated. -     EKG 12-Lead -     CBC with Differential/Platelet; Future -     Basic metabolic panel; Future -     Hepatic function panel; Future -     Troponin I (High Sensitivity); Future -     Brain natriuretic peptide;  Future -     Ambulatory referral to Cardiology -     Brain natriuretic peptide -     Troponin I (High Sensitivity) -     Hepatic function panel -     Basic metabolic panel -     CBC with Differential/Platelet -     Brain natriuretic peptide; Future -     Brain natriuretic peptide  OSA on CPAP -     Ambulatory referral to Sleep Studies  Primary hypertension- His blood pressure is not adequately well controlled.  Labs are negative for secondary causes or endorgan damage.  I recommended that he stop taking the ACE inhibitor  and try to control his blood pressure with the combination of triamterene/hydrochlorothiazide. -     EKG 12-Lead -     CBC with Differential/Platelet; Future -     Basic metabolic panel; Future -     Urinalysis, Routine w reflex microscopic; Future -     VITAMIN D 25 Hydroxy (Vit-D Deficiency, Fractures); Future -     Thyroid Panel With TSH; Future -     Hepatic function panel; Future -     Hepatic function panel -     Thyroid Panel With TSH -     VITAMIN D 25 Hydroxy (Vit-D Deficiency, Fractures) -     Urinalysis, Routine w reflex microscopic -     Basic metabolic panel -     CBC with Differential/Platelet -     triamterene-hydrochlorothiazide (DYAZIDE) 37.5-25 MG capsule; Take 1 each (1 capsule total) by mouth daily.  Encounter for general adult medical examination with abnormal findings- Exam completed, labs reviewed, vaccines are up-to-date, cancer screenings addressed, patient education was given. -     Lipid panel; Future -     PSA; Future -     PSA -     Lipid panel  Morbid obesity (HCC)- Labs are negative for secondary causes or complications. -     Thyroid Panel With TSH; Future -     Hemoglobin A1c; Future -     Hepatic function panel; Future -     Hepatic function panel -     Hemoglobin A1c -     Thyroid Panel With TSH  Abnormal electrocardiogram (ECG) (EKG) -     Ambulatory referral to Cardiology  Erectile dysfunction due to arterial  insufficiency- His testosterone level is low. -     Testosterone Total,Free,Bio, Males; Future -     Testosterone Total,Free,Bio, Males  Hypogonadism male -     Ambulatory referral to Endocrinology  Chronic daily headache -     Ambulatory referral to Neurology  Vitamin D deficiency disease -     Cholecalciferol 1.25 MG (50000 UT) capsule; Take 1 capsule (50,000 Units total) by mouth once a week.  Hyperlipidemia with target LDL less than 130- His ASCVD risk score is just above 20% so of asked him to take a statin for CV risk reduction. -     rosuvastatin (CRESTOR) 20 MG tablet; Take 1 tablet (20 mg total) by mouth daily.  Deficiency anemia- I have asked him to return to have this evaluated.  Other orders -     LDL cholesterol, direct  I have discontinued Marc Bradley's ketoconazole, topiramate, SUMAtriptan, and lisinopril-hydrochlorothiazide. I am also having him start on Cholecalciferol, triamterene-hydrochlorothiazide, and rosuvastatin.  Meds ordered this encounter  Medications   Cholecalciferol 1.25 MG (50000 UT) capsule    Sig: Take 1 capsule (50,000 Units total) by mouth once a week.    Dispense:  12 capsule    Refill:  1   triamterene-hydrochlorothiazide (DYAZIDE) 37.5-25 MG capsule    Sig: Take 1 each (1 capsule total) by mouth daily.    Dispense:  90 capsule    Refill:  0   rosuvastatin (CRESTOR) 20 MG tablet    Sig: Take 1 tablet (20 mg total) by mouth daily.    Dispense:  90 tablet    Refill:  1      Follow-up: Return in about 6 weeks (around 08/10/2020).  Sanda Linger, MD

## 2020-06-29 NOTE — Patient Instructions (Signed)
Health Maintenance, Male Adopting a healthy lifestyle and getting preventive care are important in promoting health and wellness. Ask your health care provider about: The right schedule for you to have regular tests and exams. Things you can do on your own to prevent diseases and keep yourself healthy. What should I know about diet, weight, and exercise? Eat a healthy diet  Eat a diet that includes plenty of vegetables, fruits, low-fat dairy products, and lean protein. Do not eat a lot of foods that are high in solid fats, added sugars, or sodium.  Maintain a healthy weight Body mass index (BMI) is a measurement that can be used to identify possible weight problems. It estimates body fat based on height and weight. Your health care provider can help determine your BMI and help you achieve or maintain ahealthy weight. Get regular exercise Get regular exercise. This is one of the most important things you can do for your health. Most adults should: Exercise for at least 150 minutes each week. The exercise should increase your heart rate and make you sweat (moderate-intensity exercise). Do strengthening exercises at least twice a week. This is in addition to the moderate-intensity exercise. Spend less time sitting. Even light physical activity can be beneficial. Watch cholesterol and blood lipids Have your blood tested for lipids and cholesterol at 45 years of age, then havethis test every 5 years. You may need to have your cholesterol levels checked more often if: Your lipid or cholesterol levels are high. You are older than 45 years of age. You are at high risk for heart disease. What should I know about cancer screening? Many types of cancers can be detected early and may often be prevented. Depending on your health history and family history, you may need to have cancer screening at various ages. This may include screening for: Colorectal cancer. Prostate cancer. Skin cancer. Lung  cancer. What should I know about heart disease, diabetes, and high blood pressure? Blood pressure and heart disease High blood pressure causes heart disease and increases the risk of stroke. This is more likely to develop in people who have high blood pressure readings, are of African descent, or are overweight. Talk with your health care provider about your target blood pressure readings. Have your blood pressure checked: Every 3-5 years if you are 18-39 years of age. Every year if you are 40 years old or older. If you are between the ages of 65 and 75 and are a current or former smoker, ask your health care provider if you should have a one-time screening for abdominal aortic aneurysm (AAA). Diabetes Have regular diabetes screenings. This checks your fasting blood sugar level. Have the screening done: Once every three years after age 45 if you are at a normal weight and have a low risk for diabetes. More often and at a younger age if you are overweight or have a high risk for diabetes. What should I know about preventing infection? Hepatitis B If you have a higher risk for hepatitis B, you should be screened for this virus. Talk with your health care provider to find out if you are at risk forhepatitis B infection. Hepatitis C Blood testing is recommended for: Everyone born from 1945 through 1965. Anyone with known risk factors for hepatitis C. Sexually transmitted infections (STIs) You should be screened each year for STIs, including gonorrhea and chlamydia, if: You are sexually active and are younger than 45 years of age. You are older than 45 years of age   and your health care provider tells you that you are at risk for this type of infection. Your sexual activity has changed since you were last screened, and you are at increased risk for chlamydia or gonorrhea. Ask your health care provider if you are at risk. Ask your health care provider about whether you are at high risk for HIV.  Your health care provider may recommend a prescription medicine to help prevent HIV infection. If you choose to take medicine to prevent HIV, you should first get tested for HIV. You should then be tested every 3 months for as long as you are taking the medicine. Follow these instructions at home: Lifestyle Do not use any products that contain nicotine or tobacco, such as cigarettes, e-cigarettes, and chewing tobacco. If you need help quitting, ask your health care provider. Do not use street drugs. Do not share needles. Ask your health care provider for help if you need support or information about quitting drugs. Alcohol use Do not drink alcohol if your health care provider tells you not to drink. If you drink alcohol: Limit how much you have to 0-2 drinks a day. Be aware of how much alcohol is in your drink. In the [place] one drink equals one 12 oz bottle of beer (355 mL), one 5 oz glass of wine (148 mL), or one 1 oz glass of hard liquor (44 mL). General instructions Schedule regular health, dental, and eye exams. Stay current with your vaccines. Tell your health care provider if: You often feel depressed. You have ever been abused or do not feel safe at home. Summary Adopting a healthy lifestyle and getting preventive care are important in promoting health and wellness. Follow your health care provider's instructions about healthy diet, exercising, and getting tested or screened for diseases. Follow your health care provider's instructions on monitoring your cholesterol and blood pressure. This information is not intended to replace advice given to you by your health care provider. Make sure you discuss any questions you have with your healthcare provider. Document Revised: 12/20/2017 Document Reviewed: 12/20/2017 Elsevier Patient Education  2022 Elsevier Inc.  

## 2020-06-30 DIAGNOSIS — R519 Headache, unspecified: Secondary | ICD-10-CM | POA: Insufficient documentation

## 2020-06-30 DIAGNOSIS — E559 Vitamin D deficiency, unspecified: Secondary | ICD-10-CM | POA: Insufficient documentation

## 2020-06-30 DIAGNOSIS — E291 Testicular hypofunction: Secondary | ICD-10-CM | POA: Insufficient documentation

## 2020-06-30 DIAGNOSIS — E785 Hyperlipidemia, unspecified: Secondary | ICD-10-CM | POA: Insufficient documentation

## 2020-06-30 LAB — THYROID PANEL WITH TSH
Free Thyroxine Index: 2.7 (ref 1.4–3.8)
T3 Uptake: 31 % (ref 22–35)
T4, Total: 8.6 ug/dL (ref 4.9–10.5)
TSH: 2.24 mIU/L (ref 0.40–4.50)

## 2020-06-30 LAB — CBC WITH DIFFERENTIAL/PLATELET
Basophils Absolute: 0.1 10*3/uL (ref 0.0–0.1)
Basophils Relative: 1.3 % (ref 0.0–3.0)
Eosinophils Absolute: 0.2 10*3/uL (ref 0.0–0.7)
Eosinophils Relative: 3.6 % (ref 0.0–5.0)
HCT: 37.9 % — ABNORMAL LOW (ref 39.0–52.0)
Hemoglobin: 12.7 g/dL — ABNORMAL LOW (ref 13.0–17.0)
Lymphocytes Relative: 30 % (ref 12.0–46.0)
Lymphs Abs: 1.9 10*3/uL (ref 0.7–4.0)
MCHC: 33.5 g/dL (ref 30.0–36.0)
MCV: 91 fl (ref 78.0–100.0)
Monocytes Absolute: 0.4 10*3/uL (ref 0.1–1.0)
Monocytes Relative: 6.8 % (ref 3.0–12.0)
Neutro Abs: 3.7 10*3/uL (ref 1.4–7.7)
Neutrophils Relative %: 58.3 % (ref 43.0–77.0)
Platelets: 299 10*3/uL (ref 150.0–400.0)
RBC: 4.17 Mil/uL — ABNORMAL LOW (ref 4.22–5.81)
RDW: 13.5 % (ref 11.5–15.5)
WBC: 6.4 10*3/uL (ref 4.0–10.5)

## 2020-06-30 LAB — TESTOSTERONE TOTAL,FREE,BIO, MALES
Albumin: 4.2 g/dL (ref 3.6–5.1)
Sex Hormone Binding: 24 nmol/L (ref 10–50)
Testosterone: 180 ng/dL — ABNORMAL LOW (ref 250–827)

## 2020-06-30 LAB — HEPATIC FUNCTION PANEL
ALT: 20 U/L (ref 0–53)
AST: 17 U/L (ref 0–37)
Albumin: 4.3 g/dL (ref 3.5–5.2)
Alkaline Phosphatase: 59 U/L (ref 39–117)
Bilirubin, Direct: 0.1 mg/dL (ref 0.0–0.3)
Total Bilirubin: 0.4 mg/dL (ref 0.2–1.2)
Total Protein: 7.2 g/dL (ref 6.0–8.3)

## 2020-06-30 LAB — VITAMIN D 25 HYDROXY (VIT D DEFICIENCY, FRACTURES): VITD: 9.36 ng/mL — ABNORMAL LOW (ref 30.00–100.00)

## 2020-06-30 LAB — LIPID PANEL
Cholesterol: 206 mg/dL — ABNORMAL HIGH (ref 0–200)
HDL: 35.5 mg/dL — ABNORMAL LOW (ref >39.00)
NonHDL: 170.54
Total CHOL/HDL Ratio: 6
Triglycerides: 238 mg/dL — ABNORMAL HIGH (ref 0.0–149.0)
VLDL: 47.6 mg/dL — ABNORMAL HIGH (ref 0.0–40.0)

## 2020-06-30 LAB — BASIC METABOLIC PANEL
BUN: 17 mg/dL (ref 6–23)
CO2: 29 mEq/L (ref 19–32)
Calcium: 9.4 mg/dL (ref 8.4–10.5)
Chloride: 102 mEq/L (ref 96–112)
Creatinine, Ser: 1.35 mg/dL (ref 0.40–1.50)
GFR: 63.56 mL/min (ref >60.00)
Glucose, Bld: 82 mg/dL (ref 70–99)
Potassium: 4.1 mEq/L (ref 3.5–5.1)
Sodium: 138 mEq/L (ref 135–145)

## 2020-06-30 LAB — URINALYSIS, ROUTINE W REFLEX MICROSCOPIC
Bilirubin Urine: NEGATIVE
Hgb urine dipstick: NEGATIVE
Ketones, ur: NEGATIVE
Leukocytes,Ua: NEGATIVE
Nitrite: NEGATIVE
RBC / HPF: NONE SEEN (ref 0–?)
Specific Gravity, Urine: 1.02 (ref 1.000–1.030)
Total Protein, Urine: NEGATIVE
Urine Glucose: NEGATIVE
Urobilinogen, UA: 0.2 (ref 0.0–1.0)
pH: 6.5 (ref 5.0–8.0)

## 2020-06-30 LAB — BRAIN NATRIURETIC PEPTIDE: Pro B Natriuretic peptide (BNP): 17 pg/mL (ref 0.0–100.0)

## 2020-06-30 LAB — PSA: PSA: 1.15 ng/mL (ref 0.10–4.00)

## 2020-06-30 LAB — TROPONIN I (HIGH SENSITIVITY): High Sens Troponin I: 4 ng/L (ref 2–17)

## 2020-06-30 LAB — LDL CHOLESTEROL, DIRECT: Direct LDL: 153 mg/dL

## 2020-06-30 LAB — HEMOGLOBIN A1C: Hgb A1c MFr Bld: 5.2 % (ref 4.6–6.5)

## 2020-06-30 MED ORDER — TRIAMTERENE-HCTZ 37.5-25 MG PO CAPS: 1.0000 | ORAL_CAPSULE | Freq: Every day | ORAL | 0 refills | Status: DC

## 2020-06-30 MED ORDER — ROSUVASTATIN CALCIUM 20 MG PO TABS: 20.0000 mg | ORAL_TABLET | Freq: Every day | ORAL | 1 refills | Status: DC

## 2020-06-30 MED ORDER — CHOLECALCIFEROL 1.25 MG (50000 UT) PO CAPS: 50000.0000 [IU] | ORAL_CAPSULE | ORAL | 1 refills | Status: DC

## 2020-07-02 DIAGNOSIS — D539 Nutritional anemia, unspecified: Secondary | ICD-10-CM | POA: Insufficient documentation

## 2020-08-04 NOTE — Progress Notes (Deleted)
Date:  08/04/2020   ID:  Marc Bradley, DOB 07-15-1975, MRN [id number]  PCP:  Etta Grandchild, MD  Cardiologist:  Tessa Lerner, DO, [facility]  (established care 08/05/2020) Former Cardiology Providers: ***  REASON FOR CONSULT: Dyspnea on exertion  REQUESTING PHYSICIAN:  Etta Grandchild, MD [address] [place],  [place] [address]  No chief complaint on file.   HPI  Marc Bradley is a 45 y.o. male who presents to the office with a chief complaint of "***." Patient's past medical history and cardiovascular risk factors include: hypertension, ***  He is referred to the office at the request of Etta Grandchild, MD for evaluation of dyspnea on exertion.  ***  History of  Denies prior history of coronary artery disease, myocardial infarction, congestive heart failure, deep venous thrombosis, pulmonary embolism, stroke, transient ischemic attack.  FUNCTIONAL STATUS: ***   ALLERGIES: No Known Allergies  MEDICATION LIST PRIOR TO VISIT: No outpatient medications have been marked as taking for the 08/05/20 encounter (Appointment) with Tessa Lerner, DO.     PAST MEDICAL HISTORY: Past Medical History:  Diagnosis Date   Hypertension    Migraine    Migraines     PAST SURGICAL HISTORY: Past Surgical History:  Procedure Laterality Date   CHOLECYSTECTOMY     SURGERY SCROTAL / TESTICULAR     age 6 per patient   TONSILLECTOMY     WISDOM TOOTH EXTRACTION      FAMILY HISTORY: The patient family history includes Cancer in his father and maternal grandmother; Diabetes in his sister; Hypertension in his father.  SOCIAL HISTORY:  The patient  reports that he has been smoking cigars. He has quit using smokeless tobacco. He reports current alcohol use. He reports that he does not use drugs.  REVIEW OF SYSTEMS: ROS  PHYSICAL EXAM: Vitals with BMI 06/29/2020 02/09/2018 09/14/2017  Height 5\' 7"  - 5\' 7"   Weight 304 lbs - 286 lbs  BMI 47.6 - 44.78  Systolic 170 135  164  Diastolic 96 94 110  Pulse 77 92 86    CONSTITUTIONAL: Well-developed and well-nourished. No acute distress.  SKIN: Skin is warm and dry. No rash noted. No cyanosis. No pallor. No jaundice HEAD: Normocephalic and atraumatic.  EYES: No scleral icterus MOUTH/THROAT: Moist oral membranes.  NECK: No JVD present. No thyromegaly noted. No carotid bruits  LYMPHATIC: No visible cervical adenopathy.  CHEST Normal respiratory effort. No intercostal retractions  LUNGS: *** No stridor. No wheezes. No rales.  CARDIOVASCULAR: *** ABDOMINAL: No apparent ascites.  EXTREMITIES: No peripheral edema  HEMATOLOGIC: No significant bruising NEUROLOGIC: Oriented to person, place, and time. Nonfocal. Normal muscle tone.  PSYCHIATRIC: Normal mood and affect. Normal behavior. Cooperative  CARDIAC DATABASE: EKG: ***  Echocardiogram: No results found for this or any previous visit from the past 1095 days.    Stress Testing: No results found for this or any previous visit from the past 1095 days.   Heart Catheterization: ***  LABORATORY DATA: CBC Latest Ref Rng & Units 06/29/2020 07/11/2017  WBC 4.0 - 10.5 K/uL 6.4 6.8  Hemoglobin 13.0 - 17.0 g/dL 12.7(L) 13.4  Hematocrit 39.0 - 52.0 % 37.9(L) 39.6  Platelets 150.0 - 400.0 K/uL 299.0 307.0    CMP Latest Ref Rng & Units 06/29/2020 07/11/2017  Glucose 70 - 99 mg/dL 82 98  BUN 6 - 23 mg/dL 17 20  Creatinine 07/01/2020 - 1.50 mg/dL 09/11/2017 1.24  Sodium 5.80 - 145 mEq/L 138 139  Potassium  3.5 - 5.1 mEq/L 4.1 4.7  Chloride 96 - 112 mEq/L 102 104  CO2 19 - 32 mEq/L 29 28  Calcium 8.4 - 10.5 mg/dL 9.4 9.7  Total Protein 6.0 - 8.3 g/dL 7.2 -  Total Bilirubin 0.2 - 1.2 mg/dL 0.4 -  Alkaline Phos 39 - 117 U/L 59 -  AST 0 - 37 U/L 17 -  ALT 0 - 53 U/L 20 -    Lipid Panel     Component Value Date/Time   CHOL 206 (H) 06/29/2020 1630   TRIG 238.0 (H) 06/29/2020 1630   HDL 35.50 (L) 06/29/2020 1630   CHOLHDL 6 06/29/2020 1630   VLDL 47.6 (H) 06/29/2020  1630   LDLDIRECT 153.0 06/29/2020 1630    No components found for: NTPROBNP Recent Labs    06/30/20 1222  PROBNP 17.0   Recent Labs    06/29/20 1630  TSH 2.24    BMP Recent Labs    06/29/20 1630  NA 138  K 4.1  CL 102  CO2 29  GLUCOSE 82  BUN 17  CREATININE 1.35  CALCIUM 9.4    HEMOGLOBIN A1C Lab Results  Component Value Date   HGBA1C 5.2 06/29/2020    IMPRESSION:  No diagnosis found.   RECOMMENDATIONS: Marc Bradley is a 45 y.o. male whose past medical history and cardiac risk factors include: ***   FINAL MEDICATION LIST END OF ENCOUNTER: No orders of the defined types were placed in this encounter.   There are no discontinued medications.   Current Outpatient Medications:    Cholecalciferol 1.25 MG (50000 UT) capsule, Take 1 capsule (50,000 Units total) by mouth once a week., Disp: 12 capsule, Rfl: 1   rosuvastatin (CRESTOR) 20 MG tablet, Take 1 tablet (20 mg total) by mouth daily., Disp: 90 tablet, Rfl: 1   triamterene-hydrochlorothiazide (DYAZIDE) 37.5-25 MG capsule, Take 1 each (1 capsule total) by mouth daily., Disp: 90 capsule, Rfl: 0  No orders of the defined types were placed in this encounter.   There are no Patient Instructions on file for this visit.   --Continue cardiac medications as reconciled in final medication list. --No follow-ups on file. Or sooner if needed. --Continue follow-up with your primary care physician regarding the management of your other chronic comorbid conditions.  Patient's questions and concerns were addressed to his satisfaction. He voices understanding of the instructions provided during this encounter.   This note was created using a voice recognition software as a result there may be grammatical errors inadvertently enclosed that do not reflect the nature of this encounter. Every attempt is made to correct such errors.  Tessa Lerner, [place], [facility]  Pager: [phone] Office: [phone]

## 2020-08-05 ENCOUNTER — Ambulatory Visit: Payer: BC Managed Care – PPO | Admitting: Cardiology

## 2020-09-01 ENCOUNTER — Ambulatory Visit: Payer: BC Managed Care – PPO | Admitting: Neurology

## 2020-09-01 ENCOUNTER — Other Ambulatory Visit: Payer: Self-pay

## 2020-09-01 ENCOUNTER — Encounter: Payer: Self-pay | Admitting: Neurology

## 2020-09-01 VITALS — BP 144/84 | HR 75 | Ht 67.0 in | Wt 305.5 lb

## 2020-09-01 DIAGNOSIS — E662 Morbid (severe) obesity with alveolar hypoventilation: Secondary | ICD-10-CM

## 2020-09-01 DIAGNOSIS — G4733 Obstructive sleep apnea (adult) (pediatric): Secondary | ICD-10-CM

## 2020-09-01 DIAGNOSIS — G4719 Other hypersomnia: Secondary | ICD-10-CM | POA: Diagnosis not present

## 2020-09-01 DIAGNOSIS — Z9989 Dependence on other enabling machines and devices: Secondary | ICD-10-CM

## 2020-09-01 DIAGNOSIS — I10 Essential (primary) hypertension: Secondary | ICD-10-CM | POA: Diagnosis not present

## 2020-09-01 NOTE — Patient Instructions (Signed)
[email or web address]-[email or web address]-syndrome.[email or web address]">  Obesity-Hypoventilation Syndrome Obesity-hypoventilation syndrome (OHS) is a condition in which a person cannot efficiently move air in and out of the lungs (ventilate). This condition causes hypoventilation, which means the blood will have a buildup of carbon dioxideand a drop in oxygen levels. Key factors for OHS include having too much body fat, or obesity, and having high levels of awake daytime carbon dioxide (hypercapnia). OHS can increase the risk for: Cor pulmonale, or right-sided heart failure. Congestive heart failure. Pulmonary hypertension, or high blood pressure in the arteries in the lungs. Too many red blood cells in the body. Disability or death. What are the causes? This condition may be caused by: Being obese with a BMI (body mass index) greater than or equal to 30 kg/m2. Having too much fat around the abdomen, chest, and neck. The brain being unable to properly manage the high carbon dioxide and low oxygen levels. Hormones made by fat cells. These hormones may interfere with breathing. Sleep apnea. This is when breathing stops, pauses, or is shallow during sleep. What are the signs or symptoms? Symptoms of this condition include: Feeling sleepy during the day. Headaches. These may be worse in the morning. Shortness of breath. Snoring, choking, gasping, or trouble breathing during sleep. Poor concentration or poor memory. Mood changes or feeling irritable. Depression. How is this diagnosed? This condition may be diagnosed by: BMI measurement. Blood tests to measure blood levels of serum bicarbonate, carbon dioxide, and oxygen. Pulse oximetry to measure the amount of oxygen in your blood. This uses a small device that is placed on your finger, earlobe, or toe. Polysomnogram, or sleep study, to check your breathing patterns and levels of oxygen and carbon dioxide while  you sleep. You may also have other tests, including: A chest X-ray to rule out other breathing problems. Lung tests, or pulmonary function tests, to rule out other breathing problems. ECG (electrocardiogram) or echocardiogram to check for signs of heart failure. How is this treated? This condition may be treated with: A device such as a continuous positive airway pressure (CPAP) machine or a bi-level positive airway pressure (BPAP) machine. These devices deliver pressure and sometimes oxygen to make breathing easier. A mask may be placed over your nose or mouth. Oxygen if your blood oxygen levels are low. A weight-loss program. Bariatric, or weight-loss, surgery. Tracheostomy. A tube is placed in the windpipe through the neck to help with breathing. Follow these instructions at home:  Medicines Take over-the-counter and prescription medicines only as told by your health care provider. Ask your health care provider what medicines are safe for you. You may be told to avoid medicines such as sedatives and narcotics. These can affect breathing and make OHS worse. Sleeping habits If you are prescribed a CPAP or a BPAP machine, make sure you understand how to use it. Use your CPAP or BPAP machine only as told by your health care provider. Try to get at least 8 hours of sleep every night. Eating and drinking  Eat foods that are high in fiber, such as beans, whole grains, and fresh fruits and vegetables. Limit foods that are high in fat and processed sugars, such as fried or sweet foods. Drink enough fluid to keep your urine pale yellow. Do not drink alcohol if: Your health care provider tells you not to drink. You are pregnant, may be pregnant, or are planning to become pregnant.  General instructions Follow a diet and exercise plan that helps you reach and keep a healthy weight   as told by your health care provider. Exercise regularly as told by your health care provider. Do not use any  products that contain nicotine or tobacco, such as cigarettes, e-cigarettes, and chewing tobacco. If you need help quitting, ask your health care provider. Keep all follow-up visits as told by your health care provider. This is important. Contact a health care provider if: You develop new or worsening shortness of breath. You are having trouble waking up or staying awake. You are confused. You have chest pain. You have fast or irregular heartbeats. You are dizzy or you faint. You develop a cough. You have a fever. Get help right away if: You have any symptoms of a stroke. "BE FAST" is an easy way to remember the main warning signs of a stroke: B - Balance. Signs are dizziness, sudden trouble walking, or loss of balance. E - Eyes. Signs are trouble seeing or a sudden change in vision. F - Face. Signs are sudden weakness or numbness of the face, or the face or eyelid drooping on one side. A - Arms. Signs are weakness or numbness in an arm. This happens suddenly and usually on one side of the body. S - Speech. Signs are sudden trouble speaking, slurred speech, or trouble understanding what people say. T - Time. Time to call emergency services. Write down what time symptoms started. You have other signs of a stroke, such as: A sudden, severe headache with no known cause. Nausea or vomiting. Seizure. These symptoms may represent a serious problem that is an emergency. Do not wait to see if the symptoms will go away. Get medical help right away. Call your local emergency services (911 in the [place] Do not drive yourself to the hospital. If you ever feel like you may hurt yourself or others, or have thoughts about taking your own life, get help right away. Go to your nearest emergency department or: Call your local emergency services (911 in the [place] Call a suicide crisis helpline, such as the National Suicide Prevention Lifeline at (309)493-5400. This is open 24 hours a day in the [place] Text  the Crisis Text Line at 225 136 3552 (in the [place] Summary Obesity-hypoventilation syndrome (OHS) causes hypoventilation, which means the blood will have a buildup of carbon dioxideand a drop in oxygen levels. Key factors for OHS include having too much body fat, or obesity, and having high levels of awake daytime carbon dioxide (hypercapnia). OHS can increase the risk for heart failure, pulmonary hypertension, disability, and death. Follow your diet and exercise plan as told by your health care provider. This information is not intended to replace advice given to you by your health care provider. Make sure you discuss any questions you have with your healthcare provider. Document Revised: 12/21/2018 Document Reviewed: 12/21/2018 Elsevier Patient Education  2022 Elsevier Inc. Quality Sleep Information, Adult Quality sleep is important for your mental and physical health. It also improves your quality of life. Quality sleep means you: Are asleep for most of the time you are in bed. Fall asleep within 30 minutes. Wake up no more than once a night.  Are awake for no longer than 20 minutes if you do wake up during the night. Most adults need 7-8 hours of quality sleep each night. How can poor sleep affect me? If you do not get enough quality sleep, you may have: Mood swings. Daytime sleepiness. Confusion. Decreased reaction time. Sleep disorders, such as insomnia and sleep apnea. Difficulty with: Solving problems. Coping with stress. Paying  attention. These issues may affect your performance and productivity at work, school, and at home. Lack of sleep may also put you at higher risk for accidents, suicide,and risky behaviors. If you do not get quality sleep you may also be at higher risk for several health problems, including: Infections. Type 2 diabetes. Heart disease. High blood pressure. Obesity. Worsening of long-term conditions, like arthritis, kidney disease, depression, Parkinson's  disease, and epilepsy. What actions can I take to get more quality sleep?     Stick to a sleep schedule. Go to sleep and wake up at about the same time each day. Do not try to sleep less on weekdays and make up for lost sleep on weekends. This does not work. Try to get about 30 minutes of exercise on most days. Do not exercise 2-3 hours before going to bed. Limit naps during the day to 30 minutes or less. Do not use any products that contain nicotine or tobacco, such as cigarettes or e-cigarettes. If you need help quitting, ask your health care provider. Do not drink caffeinated beverages for at least 8 hours before going to bed. Coffee, tea, and some sodas contain caffeine. Do not drink alcohol close to bedtime. Do not eat large meals close to bedtime. Do not take naps in the late afternoon. Try to get at least 30 minutes of sunlight every day. Morning sunlight is best. Make time to relax before bed. Reading, listening to music, or taking a hot bath promotes quality sleep. Make your bedroom a place that promotes quality sleep. Keep your bedroom dark, quiet, and at a comfortable room temperature. Make sure your bed is comfortable. Take out sleep distractions like TV, a computer, smartphone, and bright lights. If you are lying awake in bed for longer than 20 minutes, get up and do a relaxing activity until you feel sleepy. Work with your health care provider to treat medical conditions that may affect sleeping, such as: Nasal obstruction. Snoring. Sleep apnea and other sleep disorders. Talk to your health care provider if you think any of your prescription medicines may cause you to have difficulty falling or staying asleep. If you have sleep problems, talk with a sleep consultant. If you think you have a sleep disorder, talk with your health care provider about getting evaluated by a specialist. Where to find more information National Sleep Foundation website:  https://sleepfoundation.org National Heart, Lung, and Blood Institute (NHLBI): https://hall.info/.pdf Centers for Disease Control and Prevention (CDC): Detail[employer].is Contact a health care provider if you: Have trouble getting to sleep or staying asleep. Often wake up very early in the morning and cannot get back to sleep. Have daytime sleepiness. Have daytime sleep attacks of suddenly falling asleep and sudden muscle weakness (narcolepsy). Have a tingling sensation in your legs with a strong urge to move your legs (restless legs syndrome). Stop breathing briefly during sleep (sleep apnea). Think you have a sleep disorder or are taking a medicine that is affecting your quality of sleep. Summary Most adults need 7-8 hours of quality sleep each night. Getting enough quality sleep is an important part of health and well-being. Make your bedroom a place that promotes quality sleep and avoid things that may cause you to have poor sleep, such as alcohol, caffeine, smoking, and large meals. Talk to your health care provider if you have trouble falling asleep or staying asleep. This information is not intended to replace advice given to you by your health care provider. Make sure you discuss any questions  you have with your healthcare provider. Document Revised: 04/05/2017 Document Reviewed: 04/05/2017 Elsevier Patient Education  2022 [employer].

## 2020-09-01 NOTE — Progress Notes (Signed)
SLEEP MEDICINE CLINIC    Provider:  Melvyn Novasarmen  Miquan Tandon, MD  Primary Care Physician:  Etta GrandchildJones, Thomas L, MD [address] [place] [id number]     Referring Provider: Etta GrandchildJones, Thomas L, Md [address] [place] [id number]          Chief Complaint according to patient   Patient presents with:     New Patient (Initial Visit)     Rm 10, alone. Internal referral for OSA, on CPAP, CPA is 89 or 45 years old.. Pt had a ss and was dx with OSA, about 10 years ago by [place] He has been using his cpap machine recently more often , was not compliant for a while - and has been doing well. Pt would like to try a different mask.        HISTORY OF PRESENT ILLNESS:  Britt BottomKevin Christian Heatherly is a 45 year old African American male patient who is seen here upon referral on 09/01/2020 from Dr Yetta BarreJones for a new sleep apnea evaluation.   Chief concern according to patient Internal referral for OSA, on CPAP. Pt had a ss and was dx with OSA, about 10 years ago. He has been using his cpap machine since then and has been doing well. Pt would like to try a different mask. He was evaluated for Migraines, chronic migraines, Dr Everlena CooperJaffe, DO.    has a past medical history of Hypertension, Migraine, and Morbid Obesity, OSA on CPAP.     Sleep relevant medical history: Nocturia 2-3 times, Tonsillectomy in childhood, no TBI,    Family medical /sleep history: NO other family member on CPAP with OSA, insomnia, sleep walkers.    Social history:  Patient is working as a Runner, broadcasting/film/videoteacher , [employer], special ed. and lives in a household with spouse , no pets  alone. The patient currently works/ used to work in shifts( night/ rotating) Tobacco use- one a month cigar.   ETOH use, on weekends - 3-4 , Caffeine intake in form of Coffee( /) Soda( /) Tea ( iced , in restaurant ) or energy drinks. Regular exercise - not regular Hobbies : none.       Sleep habits are as follows: The patient's dinner time is between 7-8.30 PM. The  patient goes to bed at 22.30 hours and easily asleep- with CPAP - continues to sleep for 2-3 hours, wakes for 3 bathroom breaks,. The preferred sleep position is side, with the support of 2 pillows. Dreams are reportedly frequent.  Has been woken by sharp cluster headaches.  5.30  AM is the usual rise time. The patient wakes up with an alarm.  He reports not feeling refreshed or restored in AM, with symptoms such as dry mouth, morning headaches, and residual fatigue.  Naps are taken infrequently, lasting from 30 to 60 minutes and are more refreshing than nocturnal sleep.    Review of Systems: Out of a complete 14 system review, the patient complains of only the following symptoms, and all other reviewed systems are negative.:  Fatigue, sleepiness , snoring loudly, when not on CPAP - headaches in AM and headaches wake ing him.  fragmented sleep, Nocturia,    How likely are you to doze in the following situations: 0 = not likely, 1 = slight chance, 2 = moderate chance, 3 = high chance   Sitting and Reading? Watching Television? Sitting inactive in a public place (theater or meeting)? As a passenger in a car for  an hour without a break? Lying down in the afternoon when circumstances permit? Sitting and talking to someone? Sitting quietly after lunch without alcohol? In a car, while stopped for a few minutes in traffic?   Total = 18/ 24 points - when ever not stimulated or physically active.   FSS endorsed at 25/ 63 points.   No depression. No myalgia.   Social History   Socioeconomic History   Marital status: Married    Spouse name: Tiffany   Number of children: 3   Years of education: Not on file   Highest education level: Master's degree (e.g., MA, MS, MEng, MEd, MSW, MBA)  Occupational History   Occupation: TECHNICIAN    Employer: [employer]  Tobacco Use   Smoking status: Some Days    Types: Cigars   Smokeless tobacco: Former  Substance and Sexual Activity   Alcohol  use: Yes    Comment: social drinker, wine beed and cocktails   Drug use: Never   Sexual activity: Not on file  Other Topics Concern   Not on file  Social History Narrative   Patient is right-handed. 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 Drinks 2-3 sodas a day. He does not exercise.   Social Determinants of Health   Financial Resource Strain: Not on file  Food Insecurity: Not on file  Transportation Needs: Not on file  Physical Activity: Not on file  Stress: Not on file  Social Connections: Not on file    Family History  Problem Relation Age of Onset   Cancer Father        blood - not sure what type   Hypertension Father    Diabetes Sister    Cancer Maternal Grandmother     Past Medical History:  Diagnosis Date   Hypertension    Migraine    Migraines     Past Surgical History:  Procedure Laterality Date   CHOLECYSTECTOMY     SURGERY SCROTAL / TESTICULAR     age 57 per patient   TONSILLECTOMY     WISDOM TOOTH EXTRACTION       Current Outpatient Medications on File Prior to Visit  Medication Sig Dispense Refill   Cholecalciferol 1.25 MG (50000 UT) capsule Take 1 capsule (50,000 Units total) by mouth once a week. 12 capsule 1   rosuvastatin (CRESTOR) 20 MG tablet Take 1 tablet (20 mg total) by mouth daily. 90 tablet 1   triamterene-hydrochlorothiazide (DYAZIDE) 37.5-25 MG capsule Take 1 each (1 capsule total) by mouth daily. 90 capsule 0   No current facility-administered medications on file prior to visit.    No Known Allergies  Physical exam:  Today's Vitals   09/01/20 1252  BP: (!) 144/84  Pulse: 75  Weight: (!) 305 lb 8 oz (138.6 kg)  Height: 5\' 7"  (1.702 m)   Body mass index is 47.85 kg/m.   Wt Readings from Last 3 Encounters:  09/01/20 (!) 305 lb 8 oz (138.6 kg)  06/29/20 (!) 304 lb (137.9 kg)  09/14/17 286 lb (129.7 kg)     Ht Readings from Last 3 Encounters:  09/01/20 5\' 7"  (1.702 m)  06/29/20 5\' 7"  (1.702 m)   09/14/17 5\' 7"  (1.702 m)      General: The patient is awake, alert and appears not in acute distress. The patient is well groomed. Head: Normocephalic, atraumatic. Neck is supple. Mallampati 3 plus, functional macroglossia. ,   Facial hair  neck  circumference:19 inches . Nasal airflow is patent.  Retrognathia is not seen.  Dental status: wore braces, now retainers.  Cardiovascular:  Regular rate and cardiac rhythm by pulse,  without distended neck veins. Respiratory: Lungs are clear to auscultation.  Skin:  Without evidence of ankle edema, or rash. Trunk: The patient's posture is erect.   Neurologic exam : The patient is awake and alert, oriented to place and time.   Memory subjective described as intact.  Attention span & concentration ability appears normal.  Speech is fluent,  without  dysarthria, dysphonia or aphasia.  Mood and affect are appropriate.   Cranial nerves: no loss of smell or taste reported  Pupils are equal and briskly reactive to light. Funduscopic exam deferred. .  Extraocular movements in vertical and horizontal planes were intact and without nystagmus. No Diplopia. Visual fields by finger perimetry are intact. Hearing was intact to soft voice and finger rubbing.   Facial sensation intact to fine touch.  Facial motor strength is symmetric and tongue and uvula move midline.  Neck ROM : rotation, tilt and flexion extension were normal for age and shoulder shrug was symmetrical.    Motor exam:  Symmetric bulk, tone and ROM.   Normal tone without cog-wheeling, symmetric grip strength .   Sensory:  Fine touch, pinprick and vibration were tested  and  normal.  Proprioception tested in the upper extremities was normal.  3 Coordination: Rapid alternating movements in the fingers/hands were of normal speed.  The Finger-to-nose maneuver was intact without evidence of ataxia, dysmetria or tremor.   Gait and station: Patient could rise unassisted from a seated  position, walked without assistive device.  Stance is of normal width/ base and the patient turned with 3 steps.  Toe and heel walk were deferred.  Deep tendon reflexes: in the  upper and lower extremities are symmetric and intact.  Babinski response was deferred.       After spending a total time of  45  minutes face to face and additional time for physical and neurologic examination, review of laboratory studies,  personal review of imaging studies, reports and results of other testing and review of referral information / records as far as provided in visit, I have established the following assessments:  1) Mr. Marc Bradley clearly describes that he sleeps better when he uses CPAP that he is less fatigued and more productive in the days after using CPAP he has been a compliant CPAP user recently he still deals with headaches sometimes when he wakes up in the morning sometimes those have woken him and he describes them as cluster headaches.  Sharp unilateral headaches sometimes a feeling of being pierced through the eye or through the temple.  He has a history of chronic migraines and was last seen by neurology 3 years ago by Dr. Everlena Cooper.  I do think that his headaches are sleep related but unfortunately they seem not to have improved that much with CPAP.  #2 he does note that he has sleep apnea he was tested in [place] almost a decade ago and his machine has not been replaced since so this is his original set of machine he has been compliant for a long time now that I can actually look back at therapeutic data 97% compliance for the last 30 days and average total time of use of 6 hours 51 minutes, he has a factory setting on his AutoSet machine between 5 and 20 cmH2O with 3 cm EPR that was  never adjusted his AHI is 2.2/h which is really low and his 95th percentile pressure setting is 15.6 cmH2O so he is definitely using a higher pressure to overcome his apneas.   Air leakage is moderate and explained by  the use of a fullface mask in conjunction with facial hair.  He seems not to have had any Cheyne-Stokes respirations or central apneas.   The machine is still working well for him.  What I would like to do is to establish a new baseline also to give him the opportunity to get a new CPAP machine.  He is not an urgent need as his one is still functioning but with his current supply chain issues it would be better to order more earlier rather than later and use one of the [employer] such as [facility] or [employer].    My Plan is to proceed with:    1)HST to confirm apnea degree and type and order a new auto CPAP. 2) Patient needs help with weight loss, and better, deeper sleep can help with that.  3) we discussed routine exercises.  4) weight and wellness.  5) headaches - may be hypoventilation?   I would like to thank Etta Grandchild, MD and Etta Grandchild, Md [address] [place],  [place] [address] for allowing me to meet with and to take care of this pleasant patient.   In short, Ranger Petrich is presenting with obesity , osa on cpap, and more sleepiness , a symptom that can be attributed to persistent hypersomnia.  There is no evidence under-treated apnea .  I plan to follow up either personally or through our NP within 2-4 month.   CC: I will share my notes with PCP .  Electronically signed by: Melvyn Novas, MD 09/01/2020 1:13 PM  [facility] Board certified by The [employer] of Sleep Medicine and Diplomate of the [employer] of Sleep Medicine. Board certified In Neurology through the ABPN, Fellow of the [employer] of Neurology. Medical Director of [facility].

## 2020-09-24 ENCOUNTER — Ambulatory Visit: Payer: BC Managed Care – PPO | Admitting: Endocrinology

## 2020-09-24 ENCOUNTER — Other Ambulatory Visit: Payer: Self-pay

## 2020-09-24 VITALS — BP 134/80 | HR 91 | Ht 67.0 in | Wt 301.0 lb

## 2020-09-24 DIAGNOSIS — E291 Testicular hypofunction: Secondary | ICD-10-CM | POA: Diagnosis not present

## 2020-09-24 LAB — BASIC METABOLIC PANEL
BUN: 17 mg/dL (ref 6–23)
CO2: 25 mEq/L (ref 19–32)
Calcium: 9.5 mg/dL (ref 8.4–10.5)
Chloride: 104 mEq/L (ref 96–112)
Creatinine, Ser: 1.09 mg/dL (ref 0.40–1.50)
GFR: 82.03 mL/min (ref >60.00)
Glucose, Bld: 109 mg/dL — ABNORMAL HIGH (ref 70–99)
Potassium: 3.6 mEq/L (ref 3.5–5.1)
Sodium: 139 mEq/L (ref 135–145)

## 2020-09-24 LAB — PROLACTIN: Prolactin: 8.7 ng/mL (ref 2.0–18.0)

## 2020-09-24 MED ORDER — SILDENAFIL CITRATE 100 MG PO TABS: 100.0000 mg | ORAL_TABLET | Freq: Every day | ORAL | 11 refills | Status: AC | PRN

## 2020-09-24 NOTE — Progress Notes (Signed)
Subjective:    Patient ID: Marc Bradley, male    DOB: 1975/02/04, 45 y.o.   MRN: [id number]  HPI Pt is referred by Dr Yetta Barre, for low testosterone level.  Pt reports he had puberty at the normal age.  He has 3 biological children, and does not wish to have any more.  He says he has never taken illicit androgens.  He has never had pituitary imaging. He has never been on any prescribed medication for hypogonadism.  He does not take antiandrogens or opioids.  He denies any h/o infertility, XRT, or genital infection.  He has never had surgery, or a serious injury to the head or genital area. He has no h/o DVT.  He does not consume alcohol excessively.  He reports chronic ED sxs, decreased libido, DOE, headache, and difficulty losing weight.   Past Medical History:  Diagnosis Date   Hypertension    Migraine    Migraines     Past Surgical History:  Procedure Laterality Date   CHOLECYSTECTOMY     SURGERY SCROTAL / TESTICULAR     age 72 per patient   TONSILLECTOMY     WISDOM TOOTH EXTRACTION      Social History   Socioeconomic History   Marital status: Married    Spouse name: Tiffany   Number of children: 3   Years of education: Not on file   Highest education level: Master's degree (e.g., MA, MS, MEng, MEd, MSW, MBA)  Occupational History   Occupation: TECHNICIAN    Employer: [employer]  Tobacco Use   Smoking status: Some Days    Types: Cigars   Smokeless tobacco: Former  Substance and Sexual Activity   Alcohol use: Yes    Comment: social drinker, wine beed and cocktails   Drug use: Never   Sexual activity: Not on file  Other Topics Concern   Not on file  Social History Narrative   Patient is right-handed. 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 Drinks 2-3 sodas a day. He does not exercise.   Social Determinants of Health   Financial Resource Strain: Not on file  Food Insecurity: Not on file  Transportation Needs: Not on file   Physical Activity: Not on file  Stress: Not on file  Social Connections: Not on file  Intimate Partner Violence: Not on file    Current Outpatient Medications on File Prior to Visit  Medication Sig Dispense Refill   Cholecalciferol 1.25 MG (50000 UT) capsule Take 1 capsule (50,000 Units total) by mouth once a week. 12 capsule 1   rosuvastatin (CRESTOR) 20 MG tablet Take 1 tablet (20 mg total) by mouth daily. 90 tablet 1   triamterene-hydrochlorothiazide (DYAZIDE) 37.5-25 MG capsule Take 1 each (1 capsule total) by mouth daily. 90 capsule 0   No current facility-administered medications on file prior to visit.    No Known Allergies  Family History  Problem Relation Age of Onset   Cancer Father        blood - not sure what type   Hypertension Father    Diabetes Sister    Cancer Maternal Grandmother     BP 134/80 (BP Location: Right Arm, Patient Position: Sitting, Cuff Size: Large)   Pulse 91   Ht 5\' 7"  (1.702 m)   Wt (!) 301 lb (136.5 kg)   SpO2 97%   BMI 47.14 kg/m    Review of Systems denies depression and numbness.  Objective:   Physical Exam VS: see vs page GEN: no distress HEAD: head: no deformity eyes: no periorbital swelling, no proptosis external nose and ears are normal NECK: supple, thyroid is not enlarged CHEST WALL: no deformity LUNGS: clear to auscultation BREASTS:  No gynecomastia CV: reg rate and rhythm, no murmur GENITALIA:  Normal male.   MUSCULOSKELETAL: muscle bulk and strength are grossly normal.  no joint swelling is seen  gait is normal and steady EXTEMITIES: no leg edema NEURO: sensation is intact to touch on all 4's SKIN:  Normal texture and temperature.  No rash or suspicious lesion is visible.  Normal male hair distribution. NODES:  None palpable at the neck PSYCH: alert, well-oriented.  Does not appear anxious nor depressed.    Lab Results  Component Value Date   TESTOSTERONE 180 (L) 06/29/2020   Lab Results  Component  Value Date   TSH 2.24 06/29/2020   T4TOTAL 8.6 06/29/2020   Lab Results  Component Value Date   PSA 1.15 06/29/2020   I have reviewed outside records, and summarized: Pt was noted to have low testosterone, and referred here.  He was seen as a new pt,  main prob addressed was headache     Assessment & Plan:  Low testosterone, new to me, uncertain etiology and prognosis Obesity: uncontrolled.  This could contribute to the above.   Patient Instructions  Blood tests are requested for you today.  We'll let you know about the results.  Based on the results, you may need an MRI of the brain.  Also, I hope to be able to prescribe for you a pill to increase the testosterone.   Testosterone treatment has risks, including increased or decreased fertility (depending on the type of treatment), hair loss, prostate cancer, benign prostate enlargement, blood clots, liver problems, lower hdl ("good cholesterol"), polycythemia (opposite of anemia), sleep apnea, and behavior changes.   Weight loss also helps the testosterone.   Bariatric Surgery You have so much to gain by losing weight.  You may have already tried every diet and exercise plan imaginable.  And, you may have sought advice from your family physician, too.   Sometimes, in spite of such diligent efforts, you may not be able to achieve long-term results by yourself.  In cases of severe obesity, bariatric or weight loss surgery is a proven method of achieving long-term weight control.  Our Services Our bariatric surgery programs offer our patients new hope and long-term weight-loss solution.  Since introducing our services in 2003, we have conducted more than 3000 successful procedures.  Our program is designated as a Investment banker, corporate by the Metabolic and Bariatric Surgery Accreditation and Quality Improvement Program (MBSAQIP), a Child psychotherapist that sets rigorous patient safety and outcome standards.  Our program is also  designated as a Engineer, manufacturing systems by [employer].   Our exceptional weight-loss surgery team specializes in diagnosis, treatment, follow-up care, and ongoing support for our patients with severe weight loss challenges.  We currently offer laparoscopic sleeve gastrectomy, gastric bypass, and adjustable gastric band (LAP-BAND).    Attend our Bariatrics Seminar Choosing to undergo a bariatric procedure is a big decision, and one that should not be taken lightly.  You now have two options in how you learn about weight-loss surgery - in person or online.  Our objective is to ensure you have all of the information that you need to evaluate the advantages and obligations of this life changing procedure.  Please note  that you are not alone in this process, and our experienced team is ready to assist and answer all of your questions.  There are several ways to register for a seminar (either on-line or in person):  Call [phone] Go on-line to [facility] and register for either type of seminar.  [email or web address]

## 2020-09-24 NOTE — Patient Instructions (Addendum)
Blood tests are requested for you today.  We'll let you know about the results.  Based on the results, you may need an MRI of the brain.  Also, I hope to be able to prescribe for you a pill to increase the testosterone.   Testosterone treatment has risks, including increased or decreased fertility (depending on the type of treatment), hair loss, prostate cancer, benign prostate enlargement, blood clots, liver problems, lower hdl ("good cholesterol"), polycythemia (opposite of anemia), sleep apnea, and behavior changes.   Weight loss also helps the testosterone.   Bariatric Surgery You have so much to gain by losing weight.  You may have already tried every diet and exercise plan imaginable.  And, you may have sought advice from your family physician, too.   Sometimes, in spite of such diligent efforts, you may not be able to achieve long-term results by yourself.  In cases of severe obesity, bariatric or weight loss surgery is a proven method of achieving long-term weight control.  Our Services Our bariatric surgery programs offer our patients new hope and long-term weight-loss solution.  Since introducing our services in 2003, we have conducted more than 3000 successful procedures.  Our program is designated as a Investment banker, corporate by the Metabolic and Bariatric Surgery Accreditation and Quality Improvement Program (MBSAQIP), a Child psychotherapist that sets rigorous patient safety and outcome standards.  Our program is also designated as a Engineer, manufacturing systems by [employer].   Our exceptional weight-loss surgery team specializes in diagnosis, treatment, follow-up care, and ongoing support for our patients with severe weight loss challenges.  We currently offer laparoscopic sleeve gastrectomy, gastric bypass, and adjustable gastric band (LAP-BAND).    Attend our Bariatrics Seminar Choosing to undergo a bariatric procedure is a big decision, and one that should not be taken  lightly.  You now have two options in how you learn about weight-loss surgery - in person or online.  Our objective is to ensure you have all of the information that you need to evaluate the advantages and obligations of this life changing procedure.  Please note that you are not alone in this process, and our experienced team is ready to assist and answer all of your questions.  There are several ways to register for a seminar (either on-line or in person):  Call [phone] Go on-line to [facility] and register for either type of seminar.  [email or web address]

## 2020-09-25 LAB — FOLLICLE STIMULATING HORMONE: FSH: 13.1 m[IU]/mL (ref 1.4–18.1)

## 2020-09-25 LAB — LUTEINIZING HORMONE: LH: 9 m[IU]/mL (ref 1.50–9.30)

## 2020-09-28 ENCOUNTER — Other Ambulatory Visit: Payer: Self-pay | Admitting: Endocrinology

## 2020-09-28 DIAGNOSIS — E230 Hypopituitarism: Secondary | ICD-10-CM

## 2020-09-28 LAB — TESTOSTERONE,FREE AND TOTAL
Testosterone, Free: 2.4 pg/mL — ABNORMAL LOW (ref 6.8–21.5)
Testosterone: 131 ng/dL — ABNORMAL LOW (ref 264–916)

## 2020-09-30 ENCOUNTER — Ambulatory Visit (INDEPENDENT_AMBULATORY_CARE_PROVIDER_SITE_OTHER): Payer: BC Managed Care – PPO | Admitting: Neurology

## 2020-09-30 DIAGNOSIS — G4733 Obstructive sleep apnea (adult) (pediatric): Secondary | ICD-10-CM | POA: Diagnosis not present

## 2020-09-30 DIAGNOSIS — G4719 Other hypersomnia: Secondary | ICD-10-CM

## 2020-09-30 DIAGNOSIS — I10 Essential (primary) hypertension: Secondary | ICD-10-CM

## 2020-09-30 DIAGNOSIS — G43719 Chronic migraine without aura, intractable, without status migrainosus: Secondary | ICD-10-CM

## 2020-09-30 DIAGNOSIS — Z9989 Dependence on other enabling machines and devices: Secondary | ICD-10-CM

## 2020-09-30 DIAGNOSIS — E662 Morbid (severe) obesity with alveolar hypoventilation: Secondary | ICD-10-CM

## 2020-10-05 NOTE — Progress Notes (Signed)
      [facility] SLEEP TEST REPORT ( by Watch PAT)   STUDY DATE: 09-30-2020  DOB:  [birth year]    ORDERING CLINICIAN: Melvyn Novas, MD  REFERRING CLINICIAN: Dr. Yetta Barre.    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 He has been using his CPAP machine since and still has excessive daytime sleepiness. Pt would like to try a different mask and stated he was snoring loudly when not on CPAP. He was evaluated for Migraines, chronic migraines, by Dr Everlena Cooper, DO. He has a past medical history of Hypertension, Migraine, and Morbid Obesity, OSA on CPAP. frequent dreams.  Sleep relevant medical history: Super-obesity, Nocturia 2-3 times, Tonsillectomy in childhood, EDS, hypo-testosterone.  Epworth sleepiness score: 18/24.   BMI: 47.8kg/m   Neck Circumference: 19"   FINDINGS:   Sleep Summary:   Total Recording Time (hours, min): The total recording time for this home sleep test by watch pat technology amounted to used 7 hours and 52 minutes of which 6 hours and 56 minutes were the calculated total sleep time.  REM sleep amounted to 17% of sleep time.       Respiratory Indices:   The overall apnea hypopnea index was 51.7/h, during REM sleep 66.1/h and during non-REM sleep AHI was 49.1/h.  22% of Cheyne-Stokes respirations were noted.  In supine sleep position the AHI increased to 76.6/h and in nonsupine it was at 41.1/h.  The lowest AHI was seen sleeping on the right lateral sleep position with an AHI of 23.9/h.  Snoring level was average on moderate with a mean volume of 41 dB, snoring was recorded for 20.7% of the total sleep time.                                                        Oxygen Saturation Statistics: Oxybutynin saturation varied between a nadir of 69% and a maximum saturation of 98% with a mean oxygen saturation level of 92%.  Hypoxia with a saturation level  under 89% oxygen was noted for 25.3 minutes during the study which was equal of 2.4% of sleep time.   Pulse Rate Statistics:   Pulse range varied between a minimum of 57 and a maximum of 98 bpm with a mean heart rate of 77 bpm.   IMPRESSION:  This HST confirms the presence of severe sleep apnea complex in origin but vastly more obstructive than central events were noticed.  The hemogram also appeared to show fragmented sleep.  The patient is likely CPAP dependent - using currently an autotitration device with a 95% pressure at 16 cm water.     RECOMMENDATION: Based on this data I would order a new CPAP machine to be an autotitrator with a pressure setting between 7 and 19 cmH2O pressure and with 2 cm water pressure EPR, heated humidification and mask of patient's choice.    INTERPRETING PHYSICIAN:   Melvyn Novas, MD   Medical Director of [facility] at [facility].

## 2020-10-14 ENCOUNTER — Telehealth: Payer: Self-pay

## 2020-10-14 NOTE — Telephone Encounter (Signed)
Pt calling for HST results. He completed on 09/30/20. Thank you

## 2020-10-14 NOTE — Telephone Encounter (Signed)
Patient called asking about the results from the home sleep study he completed on 9/21. Told patient I would let the nurses know he called and someone would be in touch. Please return patient's call at [phone]

## 2020-10-20 NOTE — Progress Notes (Signed)
IMPRESSION:  This HST confirms the presence of severe sleep apnea complex in origin but vastly more obstructive than central events were noticed.  The overall apnea hypopnea index was 51.7/h,The hypnogram also appears to show fragmented sleep.   The patient is likely CPAP dependent - using currently an autotitration device with a 95% pressure at 16 cm water.   Hypoxia was noted- and we will need an ONO while on CPAP to confirm that no additional oxygen need remains.   RECOMMENDATION: Based on this data I would order a new CPAP machine to be an autotitrator with a pressure setting between 7 and 19 cmH2O pressure and with 2 cm water pressure EPR, heated humidification and mask of patient's choice.

## 2020-10-20 NOTE — Addendum Note (Signed)
Addended by: Melvyn Novas on: 10/20/2020 05:02 PM   Modules accepted: Orders

## 2020-10-21 ENCOUNTER — Telehealth: Payer: Self-pay | Admitting: *Deleted

## 2020-10-21 NOTE — Telephone Encounter (Signed)
-----   Message from Melvyn Novas, MD sent at 10/20/2020  5:02 PM EDT ----- IMPRESSION:  This HST confirms the presence of severe sleep apnea complex in origin but vastly more obstructive than central events were noticed.  The overall apnea hypopnea index was 51.7/h,The hypnogram also appears to show fragmented sleep.   The patient is likely CPAP dependent - using currently an autotitration device with a 95% pressure at 16 cm water.   Hypoxia was noted- and we will need an ONO while on CPAP to confirm that no additional oxygen need remains.   RECOMMENDATION: Based on this data I would order a new CPAP machine to be an autotitrator with a pressure setting between 7 and 19 cmH2O pressure and with 2 cm water pressure EPR, heated humidification and mask of patient's choice.

## 2020-10-21 NOTE — Telephone Encounter (Signed)
Tried calling pt about ss results. Went to VM, it is full, cannot leave message. Sent mychart message as well.

## 2020-11-09 NOTE — Telephone Encounter (Signed)
Pt returned call.  I advised pt that Dr. Epimenio Foot reviewed their sleep study results and found that pt has severe sleep apnea. Dr. Vickey Huger recommends that pt starts auto CPAP. I reviewed PAP compliance expectations with the pt. Pt is agreeable to starting a CPAP. I advised pt that an order will be sent to a DME, [facility], and [facility] will call the pt within about one week after they file with the pt's insurance. [facility] will show the pt how to use the machine, fit for masks, and troubleshoot the CPAP if needed. A follow up appt was made for insurance purposes with Dr. Vickey Huger on 02/08/2021 at 3:30 pm. Pt verbalized understanding to arrive 15 minutes early and bring their CPAP. A letter with all of this information in it will be mailed to the pt as a reminder. I verified with the pt that the address we have on file is correct. Pt verbalized understanding of results. Pt had no questions at this time but was encouraged to call back if questions arise. I have sent the order to [facility] and have received confirmation that they have received the order.

## 2021-02-08 ENCOUNTER — Ambulatory Visit: Payer: BC Managed Care – PPO | Admitting: Neurology

## 2021-02-26 ENCOUNTER — Ambulatory Visit: Payer: BC Managed Care – PPO | Admitting: Neurology

## 2021-04-12 ENCOUNTER — Encounter: Payer: Self-pay | Admitting: Neurology

## 2021-04-12 ENCOUNTER — Ambulatory Visit: Payer: BC Managed Care – PPO | Admitting: Neurology

## 2021-04-12 VITALS — BP 161/104 | HR 118 | Ht 67.0 in | Wt 309.5 lb

## 2021-04-12 DIAGNOSIS — G4719 Other hypersomnia: Secondary | ICD-10-CM | POA: Diagnosis not present

## 2021-04-12 DIAGNOSIS — G4733 Obstructive sleep apnea (adult) (pediatric): Secondary | ICD-10-CM | POA: Diagnosis not present

## 2021-04-12 DIAGNOSIS — Z9989 Dependence on other enabling machines and devices: Secondary | ICD-10-CM

## 2021-04-12 DIAGNOSIS — R519 Headache, unspecified: Secondary | ICD-10-CM

## 2021-04-12 DIAGNOSIS — Z6841 Body Mass Index (BMI) 40.0 and over, adult: Secondary | ICD-10-CM

## 2021-04-12 NOTE — Patient Instructions (Signed)
Obesity-Hypoventilation Syndrome ?Obesity-hypoventilation syndrome (OHS) is a condition in which a person cannot efficiently move air in and out of the lungs (ventilate). This condition causes a buildup of carbon dioxidelevels in the blood and a drop in oxygen levels. ?OHS can increase the risk for: ?Cor pulmonale, or right-sided heart failure. ?Left-sided heart failure. ?Pulmonary hypertension, or high blood pressure in the arteries in the lungs. ?Too many red blood cells in the body. ?Disability or death. ?What are the causes? ?This condition may be caused by: ?Being obese with a BMI (body mass index) greater than or equal to 30 kg/m2. ?Having too much fat around the abdomen, chest, and neck. ?The brain being unable to properly manage the high carbon dioxide and low oxygen levels. ?Hormones made by fat cells. These hormones may interfere with breathing. ?Sleep apnea. This is when breathing stops, pauses, or is shallow during sleep. ?What are the signs or symptoms? ?Symptoms of this condition include: ?Feeling sleepy during the day. ?Headaches. These may be worse in the morning. ?Shortness of breath. ?Snoring, choking, gasping, or trouble breathing during sleep. ?Poor concentration or poor memory. ?Mood changes or feeling irritable. ?Depression. ?How is this diagnosed? ?This condition may be diagnosed by: ?BMI measurement. ?Blood tests to measure blood levels of serum bicarbonate, carbon dioxide, and oxygen. ?Pulse oximetry to measure the amount of oxygen in your blood. This uses a small device that is placed on your finger, earlobe, or toe. ?Polysomnogram, or sleep study, to check your breathing patterns and levels of oxygen and carbon dioxide while you sleep. ?You may also have other tests, including: ?A chest X-ray to rule out other breathing problems. ?Lung tests, or pulmonary function tests, to rule out other breathing problems. ?Electrocardiogram (ECG) or echocardiogram to check for signs of heart  failure. ?How is this treated? ?This condition may be treated with: ?A device such as a continuous positive airway pressure (CPAP) machine or a bi-level positive airway pressure (BIPAP) machine. These devices deliver pressure and sometimes oxygen to make breathing easier. A mask may be placed over your nose or mouth. ?Oxygen if your blood oxygen levels are low. ?A weight-loss program. ?Bariatric, or weight-loss, surgery. ?Tracheostomy. A tube is placed in the windpipe through the neck to help with breathing. ?Follow these instructions at home: ?Medicines ?Take over-the-counter and prescription medicines only as told by your health care provider. ?Ask your health care provider what medicines are safe for you. You may be told to avoid medicines such as sedatives and narcotics. These can affect breathing and make OHS worse. ?Sleeping habits ?If you are prescribed a CPAP or a BIPAP machine, make sure you understand how to use it. Use your CPAP or BIPAP machine only as told by your health care provider. ?Try to get at least 8 hours of sleep every night. ?Eating and drinking ? ?Eat foods that are high in fiber, such as beans, whole grains, and fresh fruits and vegetables. ?Limit foods that are high in fat and processed sugars, such as fried or sweet foods. ?Drink enough fluid to keep your urine pale yellow. ?Do not drink alcohol if: ?Your health care provider tells you not to drink. ?You are pregnant, may be pregnant, or are planning to become pregnant. ?General instructions ?Follow a diet and exercise plan that helps you reach and keep a healthy weight as told by your health care provider. ?Exercise regularly as told by your health care provider. ?Do not use any products that contain nicotine or tobacco. These products include   cigarettes, chewing tobacco, and vaping devices, such as e-cigarettes. If you need help quitting, ask your health care provider. ?Keep all follow-up visits. This is important. ?Contact a health  care provider if: ?You develop new or worsening shortness of breath. ?You are having trouble waking up or staying awake. ?You are confused. ?You develop a cough. ?You have a fever. ?Get help right away if: ?You have chest pain. ?You have fast or irregular heartbeats. ?You are dizzy or you faint. ?You have any symptoms of a stroke. "BE FAST" is an easy way to remember the main warning signs of a stroke: ?B - Balance. Signs are dizziness, sudden trouble walking, or loss of balance. ?E - Eyes. Signs are trouble seeing or a sudden change in vision. ?F - Face. Signs are sudden weakness or numbness of the face, or the face or eyelid drooping on one side. ?A - Arms. Signs are weakness or numbness in an arm. This happens suddenly and usually on one side of the body. ?S - Speech. Signs are sudden trouble speaking, slurred speech, or trouble understanding what people say. ?T - Time. Time to call emergency services. Write down what time symptoms started. ?You have other signs of a stroke, such as: ?A sudden, severe headache with no known cause. ?Nausea or vomiting. ?Seizure. ?These symptoms may be an emergency. Get help right away. Call 911. ?Do not wait to see if the symptoms will go away. ?Do not drive yourself to the hospital. ?Summary ?Obesity-hypoventilation syndrome (OHS) causes a buildup of carbon dioxidelevels in the blood and a drop in oxygen levels. ?OHS can increase the risk for heart failure, pulmonary hypertension, disability, and death. ?Follow your diet and exercise plan as told by your health care provider. ?Get help right away if you have any symptoms of a stroke. ?This information is not intended to replace advice given to you by your health care provider. Make sure you discuss any questions you have with your health care provider. ?Document Revised: 08/04/2020 Document Reviewed: 08/04/2020 ?Elsevier Patient Education ? 2022 Elsevier Inc[employer] ? ?

## 2021-04-12 NOTE — Progress Notes (Signed)
? ? ?S[facility]INE [facility]IC ?  ? ?Provider:  Melvyn Novasarmen  Kiya Eno, MD  ?Primary Care Physician:  Etta GrandchildJones, Thomas L, MD ?[address] ?[place] [id number]  ? ?  ?Referring Provider: Etta GrandchildJones, Thomas L, Md ?[address] ?[place],  [place] [id number]  ?  ?  ?    ?Chief Complaint according to patient   ?Patient presents with:  ?  ? New Patient (Initial Visit)  ?   Rm 10, alone. IRV with new device, following HST from 09-30-2021  ?  ?  ?HISTORY OF PRESENT ILLNESS:  ?Marc Bradley is a 46 year old African American patient who is seen here In his first RV after a September 2022 HST to confirm his baseline apnea:  ?RV on 04/12/2021 :"his HST confirms the presence of severe sleep apnea complex in origin but vastly more obstructive than central events were noticed.  The overall apnea hypopnea index was 51.7/h,The hypnogram also appears to show fragmented sleep.   ?The patient is likely CPAP dependent - using currently an autotitration device with a 95% pressure at 16 cm water.  Hypoxia was noted- and we will need an ONO while on CPAP to confirm that no additional oxygen need remains.  ?RECOMMENDATION: New CPAP machine to be an autotitrator with a pressure setting between 7 and 19 cmH2O pressure and with 2 cm water pressure EPR, heated humidification and mask of patient's choice. ?Chief concern according to patient Internal referral for OSA, on CPAP. Pt had a ss and was dx with OSA, about 10 years ago. He has been using his cpap machine since then and has been doing well. Pt would like to try a different mask. He was evaluated for Migraines, chronic migraines, Dr Everlena CooperJaffe, DO." ? ?He reports linking his new Auto device and using it compliantly, mask fits well and no condensation water is reported. His headaches are helped a lot by CPAP.  ?He endorses today the Epworth sleepiness score at 11 points, he does not feel fatigued.  His compliance report is excellent 97% for days and hours only 6 March which was a Monday was  skipped.  He average uses CPAP at 6 hours 29 minutes, the settings are as quoted above between 7 and 19 cmH2O there is no EPR and his regular 95th percentile pressure is 18.6 so he really straddles the upper settings.  His 95th percentile leak is minimal 7.3 L a minute is a very good fit, and his residual apneas are 0.4 hypopneas 1.4/h total AHI 1.8 which speaks for an excellent resolution of apnea. ?I like to offer a max setting of 20 cm water.  ? ?His main risk factor remains his weight, BMI over 45.  ? ? ? ? ? ?  ?   ?Sleep relevant medical history: Nocturia 2-3 times, Tonsillectomy in childhood, no TBI,   Family medical /sleep history: NO other family member on CPAP with OSA, insomnia, sleep walkers.  ?Social history:  Patient is working as a Runner, broadcasting/film/videoteacher , [employer], special ed. and lives in a household with spouse , no pets  alone. The patient currently works/ used to work in shifts( night/ rotating) ?Tobacco use- one a month cigar. ?ETOH use, on weekends - 3-4 , Caffeine intake in form of Coffee( /) Soda( /) Tea ( iced , in restaurant ) or energy drinks. ?Regular exercise - not regular ?Hobbies : none.  ? ?  ?  ?Sleep habits are as follows: The patient's dinner time is between 7-8.30  PM. The patient goes to bed at 22.30 hours and easily asleep- with CPAP - continues to sleep for 2-3 hours, wakes for 3 bathroom breaks,. ?The preferred sleep position is side, with the support of 2 pillows. Dreams are reportedly frequent. ? Has been woken by sharp cluster headaches.  ?5.30  AM is the usual rise time. The patient wakes up with an alarm.  ?He reports not feeling refreshed or restored in AM, with symptoms such as dry mouth, morning headaches, and residual fatigue.  ?Naps are taken infrequently, lasting from 30 to 60 minutes and are more refreshing than nocturnal sleep.  ?  ?Review of Systems: ?Out of a complete 14 system review, the patient complains of only the following symptoms, and all other reviewed systems are  negative.:  ?Fatigue, sleepiness , snoring loudly, when not on CPAP - headaches in AM and headaches wake ing him.  fragmented sleep, Nocturia,  ?  ?How likely are you to doze in the following situations: ?0 = not likely, 1 = slight chance, 2 = moderate chance, 3 = high chance ?  ?Sitting and Reading? 2 ?Watching Television? 2 ?Sitting inactive in a public place (theater or meeting)?2 ?As a passenger in a car for an hour without a break?2 ?Lying down in the afternoon when circumstances permit?3 ?Sitting and talking to someone? ?Sitting quietly after lunch without alcohol? ?In a car, while stopped for a few minutes in traffic? ?  ?Total = when not CPAP: 18/ 24 points - when ever not stimulated or physically active.  ?Now on new CPAP, 11/ 24  ? FSS endorsed at 25/ 63 points.  ? ?No depression. No myalgia.  ? ?Social History  ? ?Socioeconomic History  ? Marital status: Married  ?  Spouse name: Tiffany  ? Number of children: 3  ? Years of education: Not on file  ? Highest education level: Master's degree (e.g., MA, MS, MEng, MEd, MSW, MBA)  ?Occupational History  ? Occupation: TECHNICIAN  ?  Employer: [employer]  ?Tobacco Use  ? Smoking status: Some Days  ?  Types: Cigars  ? Smokeless tobacco: Former  ?Substance and Sexual Activity  ? Alcohol use: Yes  ?  Comment: social drinker, wine beed and cocktails  ? Drug use: Never  ? Sexual activity: Not on file  ?Other Topics Concern  ? Not on file  ?Social History Narrative  ? Patient is right-handed. 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 Drinks 2-3 sodas a day. He does not exercise.  ? ?Social Determinants of Health  ? ?Financial Resource Strain: Not on file  ?Food Insecurity: Not on file  ?Transportation Needs: Not on file  ?Physical Activity: Not on file  ?Stress: Not on file  ?Social Connections: Not on file  ? ? ?Family History  ?Problem Relation Age of Onset  ? Cancer Father   ?     blood - not sure what type  ? Hypertension Father   ?  Diabetes Sister   ? Cancer Maternal Grandmother   ? ? ?Past Medical History:  ?Diagnosis Date  ? Hypertension   ? Migraine   ? Migraines   ? ? ?Past Surgical History:  ?Procedure Laterality Date  ? CHOLECYSTECTOMY    ? SURGERY SCROTAL / TESTICULAR    ? age 58 per patient  ? TONSILLECTOMY    ? WISDOM TOOTH EXTRACTION    ?  ? ?Current Outpatient Medications on File Prior to Visit  ?  Medication Sig Dispense Refill  ? Cholecalciferol 1.25 MG (50000 UT) capsule Take 1 capsule (50,000 Units total) by mouth once a week. 12 capsule 1  ? rosuvastatin (CRESTOR) 20 MG tablet Take 1 tablet (20 mg total) by mouth daily. 90 tablet 1  ? sildenafil (VIAGRA) 100 MG tablet Take 1 tablet (100 mg total) by mouth daily as needed for erectile dysfunction. 10 tablet 11  ? triamterene-hydrochlorothiazide (DYAZIDE) 37.5-25 MG capsule Take 1 each (1 capsule total) by mouth daily. 90 capsule 0  ? ?No current facility-administered medications on file prior to visit.  ? ?Physical exam: 309 pounds, BP 161, 104 bpm. ?BMI 04-12-2021, ?Was 48.47.  ? ?There were no vitals filed for this visit. ? ?There is no height or weight on file to calculate BMI.  ? ?Wt Readings from Last 3 Encounters:  ?09/24/20 (!) 301 lb (136.5 kg)  ?09/01/20 (!) 305 lb 8 oz (138.6 kg)  ?06/29/20 (!) 304 lb (137.9 kg)  ?  ? ?Ht Readings from Last 3 Encounters:  ?09/24/20 5\' 7"  (1.702 m)  ?09/01/20 5\' 7"  (1.702 m)  ?06/29/20 5\' 7"  (1.702 m)  ?  ?  ?General: The patient is awake, alert and appears not in acute distress. The patient is well groomed. ?Head: Normocephalic, atraumatic. Neck is supple. Mallampati 3 plus, functional macroglossia. ,  ? ?Facial hair  ?neck circumference:l large 19 inches . Nasal airflow is patent.  Retrognathia is not seen.  ?Dental status: wore braces, now retainers.  ?Cardiovascular:  Regular rate and cardiac rhythm by pulse,  without distended neck veins. ?Respiratory: Lungs are clear to auscultation.  ?Skin:  Without evidence of ankle edema, or  rash. ?Trunk: The patient's posture is erect. ?  ?Neurologic exam : ?The patient is awake and alert, oriented to place and time.   ?Memory subjective described as intact.  ?Attention span & concentration ability appears norm

## 2021-04-12 NOTE — Addendum Note (Signed)
Addended by: Melvyn Novas on: 04/12/2021 09:09 AM ? ? Modules accepted: Orders ? ?

## 2021-05-28 ENCOUNTER — Other Ambulatory Visit: Payer: Self-pay | Admitting: Internal Medicine

## 2021-05-28 DIAGNOSIS — I10 Essential (primary) hypertension: Secondary | ICD-10-CM

## 2021-06-23 ENCOUNTER — Telehealth: Payer: Self-pay | Admitting: Internal Medicine

## 2021-06-23 DIAGNOSIS — I10 Essential (primary) hypertension: Secondary | ICD-10-CM

## 2021-06-23 NOTE — Telephone Encounter (Signed)
Pt is requesting callback for denial of triamterene-hydrochlorothiazide (DYAZIDE) 37.5-25 MG capsule.

## 2021-06-26 ENCOUNTER — Other Ambulatory Visit: Payer: Self-pay

## 2021-06-26 ENCOUNTER — Ambulatory Visit (INDEPENDENT_AMBULATORY_CARE_PROVIDER_SITE_OTHER): Payer: BC Managed Care – PPO

## 2021-06-26 ENCOUNTER — Encounter (HOSPITAL_COMMUNITY): Payer: Self-pay | Admitting: *Deleted

## 2021-06-26 ENCOUNTER — Ambulatory Visit (HOSPITAL_COMMUNITY)
Admission: EM | Admit: 2021-06-26 | Discharge: 2021-06-26 | Disposition: A | Discharge status: Home / Self Care | Payer: BC Managed Care – PPO | Attending: Physician Assistant | Admitting: Physician Assistant

## 2021-06-26 DIAGNOSIS — R059 Cough, unspecified: Secondary | ICD-10-CM | POA: Diagnosis not present

## 2021-06-26 DIAGNOSIS — I10 Essential (primary) hypertension: Secondary | ICD-10-CM

## 2021-06-26 DIAGNOSIS — J40 Bronchitis, not specified as acute or chronic: Secondary | ICD-10-CM | POA: Diagnosis present

## 2021-06-26 DIAGNOSIS — R051 Acute cough: Secondary | ICD-10-CM | POA: Diagnosis present

## 2021-06-26 LAB — BASIC METABOLIC PANEL
Anion gap: 9 (ref 5–15)
BUN: 10 mg/dL (ref 6–20)
CO2: 24 mmol/L (ref 22–32)
Calcium: 9 mg/dL (ref 8.9–10.3)
Chloride: 106 mmol/L (ref 98–111)
Creatinine, Ser: 1.24 mg/dL (ref 0.61–1.24)
GFR, Estimated: >60 mL/min (ref >60)
Glucose, Bld: 97 mg/dL (ref 70–99)
Potassium: 3.7 mmol/L (ref 3.5–5.1)
Sodium: 139 mmol/L (ref 135–145)

## 2021-06-26 LAB — CBC WITH DIFFERENTIAL/PLATELET
Abs Immature Granulocytes: 0.02 10*3/uL (ref 0.00–0.07)
Basophils Absolute: 0.1 10*3/uL (ref 0.0–0.1)
Basophils Relative: 1 %
Eosinophils Absolute: 0.2 10*3/uL (ref 0.0–0.5)
Eosinophils Relative: 4 %
HCT: 41.5 % (ref 39.0–52.0)
Hemoglobin: 13.4 g/dL (ref 13.0–17.0)
Immature Granulocytes: 0 %
Lymphocytes Relative: 30 %
Lymphs Abs: 1.6 10*3/uL (ref 0.7–4.0)
MCH: 30.2 pg (ref 26.0–34.0)
MCHC: 32.3 g/dL (ref 30.0–36.0)
MCV: 93.5 fL (ref 80.0–100.0)
Monocytes Absolute: 0.9 10*3/uL (ref 0.1–1.0)
Monocytes Relative: 17 %
Neutro Abs: 2.6 10*3/uL (ref 1.7–7.7)
Neutrophils Relative %: 48 %
Platelets: 312 10*3/uL (ref 150–400)
RBC: 4.44 MIL/uL (ref 4.22–5.81)
RDW: 12.6 % (ref 11.5–15.5)
WBC: 5.3 10*3/uL (ref 4.0–10.5)
nRBC: 0 % (ref 0.0–0.2)

## 2021-06-26 MED ORDER — STERILE WATER FOR INJECTION IJ SOLN
INTRAMUSCULAR | Status: AC
  Filled 2021-06-26: qty 10

## 2021-06-26 MED ORDER — PREDNISONE 20 MG PO TABS
20.0000 mg | ORAL_TABLET | Freq: Every day | ORAL | 0 refills | Status: DC
Start: 2021-06-26 — End: 2021-07-19

## 2021-06-26 MED ORDER — METHYLPREDNISOLONE ACETATE 80 MG/ML IJ SUSP
60.0000 mg | Freq: Once | INTRAMUSCULAR | Status: AC
  Administered 2021-06-26: 60 mg via INTRAMUSCULAR

## 2021-06-26 MED ORDER — TRIAMTERENE-HCTZ 37.5-25 MG PO CAPS: 1.0000 | ORAL_CAPSULE | Freq: Every day | ORAL | 0 refills | Status: AC

## 2021-06-26 MED ORDER — ALBUTEROL SULFATE HFA 108 (90 BASE) MCG/ACT IN AERS
INHALATION_SPRAY | RESPIRATORY_TRACT | Status: AC
  Filled 2021-06-26: qty 6.7

## 2021-06-26 MED ORDER — PROMETHAZINE-DM 6.25-15 MG/5ML PO SYRP: 5.0000 mL | ORAL_SOLUTION | Freq: Three times a day (TID) | ORAL | 0 refills | Status: DC | PRN

## 2021-06-26 MED ORDER — ALBUTEROL SULFATE HFA 108 (90 BASE) MCG/ACT IN AERS
2.0000 | INHALATION_SPRAY | Freq: Once | RESPIRATORY_TRACT | Status: AC
  Administered 2021-06-26: 2 via RESPIRATORY_TRACT

## 2021-06-26 MED ORDER — METHYLPREDNISOLONE SODIUM SUCC 125 MG IJ SOLR
INTRAMUSCULAR | Status: AC
  Filled 2021-06-26: qty 2

## 2021-06-26 NOTE — Discharge Instructions (Signed)
Your x-ray did not show any evidence of pneumonia.  Did show some increased vasculature so I recommend that you follow-up with your primary care and have a repeated.  Use your albuterol inhaler every 4-6 hours.  Start prednisone 20 mg for 3 days.  Do not take NSAIDs including aspirin, ibuprofen/Advil, naproxen/Aleve with this medication.  You can use Tylenol, Mucinex, Flonase for symptom relief.  Use Promethazine DM for cough.  This will make you sleepy so do not drive or drink alcohol with taking it.  If anything worsens you need to be seen immediately.  Your blood pressure is very elevated.  Please start your blood pressure medication back.  Follow-up with your PCP.  Monitor this at home.  Avoid NSAIDs including aspirin, ibuprofen/Advil, naproxen/Aleve, caffeine, sodium.  If you develop any chest pain, shortness of breath, headache, vision change in the setting of high blood pressure you need to go to the emergency room as we discussed.

## 2021-06-26 NOTE — ED Provider Notes (Addendum)
[facility]    CSN: [id number] Arrival date & time: 06/26/21  1455      History   Chief Complaint Chief Complaint  Patient presents with   Cough    Chronic cough - Entered by patient    HPI Marc Bradley is a 46 y.o. male.   Patient presents today with a 5 to 6-day history of cough that has worsened in the past several days prompting evaluation.  Reports mild nasal congestion but denies additional symptoms including fever, headache, chest pain, shortness of breath, nausea, vomiting.  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  He has not had COVID in the past.  Has had COVID-19 vaccines.  Denies any recent steroid or antibiotic use.  He has tried over-the-counter medications without improvement of symptoms.  He is having difficulty with daily activities as result of symptoms as cough is keeping him up at night.  Blood pressure is very elevated today.  He denies any chest pain, shortness of breath, headache, vision change, dizziness.  Reports that he is overdue for follow-up with his PCP and has not been able to refill his medicines has been without it recently.  Denies any increased sodium or caffeine consumption.  Denies decongestant or NSAID use.  He intends to schedule an appointment with PCP.    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   Excessive daytime sleepiness 09/01/2020   Obesity hypoventilation syndrome (HCC) 09/01/2020   Deficiency anemia 07/02/2020   Hypogonadism male 06/30/2020   Chronic daily headache 06/30/2020   Vitamin D deficiency disease 06/30/2020   Hyperlipidemia with target  LDL less than 130 06/30/2020   DOE (dyspnea on exertion) 06/29/2020   OSA on CPAP 06/29/2020   Primary hypertension 06/29/2020   Encounter for general adult medical examination with abnormal findings 06/29/2020   Abnormal electrocardiogram (ECG) (EKG) 06/29/2020   Erectile dysfunction due to arterial insufficiency 06/29/2020   Hyperlipidemia 07/17/2017   Essential hypertension 07/11/2017   Migraine 07/11/2017   Erectile dysfunction 07/11/2017   Morbid obesity (HCC) 07/11/2017    Past Surgical History:  Procedure Laterality Date   CHOLECYSTECTOMY     SURGERY SCROTAL / TESTICULAR     age 89 per patient   TONSILLECTOMY     WISDOM TOOTH EXTRACTION         Home Medications    Prior to Admission medications   Medication Sig Start Date End Date Taking? Authorizing Provider  predniSONE (DELTASONE) 20 MG tablet Take 1 tablet (20 mg total) by mouth daily. 06/26/21  Yes Valoree Agent K, PA-C  promethazine-dextromethorphan (PROMETHAZINE-DM) 6.25-15 MG/5ML syrup Take 5 mLs by mouth 3 (three) times daily as needed for cough. 06/26/21  Yes Satoria Dunlop, Noberto Retort, PA-C  Cholecalciferol 1.25 MG (50000 UT) capsule Take 1 capsule (50,000 Units total) by mouth once a week. 06/30/20   Etta Grandchild, MD  rosuvastatin (CRESTOR) 20 MG tablet Take 1 tablet (20 mg total) by mouth daily. 06/30/20   Etta Grandchild, MD  sildenafil (VIAGRA) 100 MG tablet Take 1 tablet (100 mg total) by mouth daily as needed for  erectile dysfunction. 09/24/20   Romero Belling, MD  triamterene-hydrochlorothiazide (DYAZIDE) 37.5-25 MG capsule Take 1 each (1 capsule total) by mouth daily. 06/26/21   Keilah Lemire, Noberto Retort, PA-C    Family History Family History  Problem Relation Age of Onset   Cancer Father        blood - not sure what type   Hypertension Father    Diabetes Sister    Cancer Maternal Grandmother     Social History Social History   Tobacco Use   Smoking status: Some Days    Types: Cigars   Smokeless tobacco: Former   Substance Use Topics   Alcohol use: Yes    Comment: social drinker, wine beed and cocktails   Drug use: Never     Allergies   Patient has no known allergies.   Review of Systems Review of Systems  Constitutional:  Positive for activity change. Negative for appetite change, fatigue and fever.  HENT:  Positive for congestion. Negative for sinus pressure, sneezing and sore throat.   Eyes:  Negative for visual disturbance.  Respiratory:  Positive for cough. Negative for shortness of breath.   Cardiovascular:  Negative for chest pain.  Gastrointestinal:  Negative for abdominal pain, diarrhea, nausea and vomiting.  Neurological:  Negative for dizziness, light-headedness and headaches.     Physical Exam Triage Vital Signs ED Triage Vitals  Enc Vitals Group     BP 06/26/21 1539 (!) 178/116     Pulse Rate 06/26/21 1539 96     Resp 06/26/21 1539 20     Temp 06/26/21 1539 98.6 F (37 C)     Temp src --      SpO2 06/26/21 1539 98 %     Weight --      Height --      Head Circumference --      Peak Flow --      Pain Score 06/26/21 1537 0     Pain Loc --      Pain Edu? --      Excl. in GC? --    No data found.  Updated Vital Signs BP (!) 178/116   Pulse 96   Temp 98.6 F (37 C)   Resp 20   SpO2 98%   Visual Acuity Right Eye Distance:   Left Eye Distance:   Bilateral Distance:    Right Eye Near:   Left Eye Near:    Bilateral Near:     Physical Exam Vitals reviewed.  Constitutional:      General: He is awake.     Appearance: Normal appearance. He is well-developed. He is not ill-appearing.     Comments: Very pleasant male appears stated age in no acute distress sitting comfortably in exam room  HENT:     Head: Normocephalic and atraumatic.     Right Ear: Tympanic membrane, ear canal and external ear normal. Tympanic membrane is not erythematous or bulging.     Left Ear: Tympanic membrane, ear canal and external ear normal. Tympanic membrane is not erythematous  or bulging.     Nose: Nose normal.     Mouth/Throat:     Pharynx: Uvula midline. No oropharyngeal exudate or posterior oropharyngeal erythema.  Cardiovascular:     Rate and Rhythm: Normal rate and regular rhythm.     Heart sounds: Normal heart sounds, S1 normal and S2 normal. No murmur heard. Pulmonary:     Effort: Pulmonary effort is normal. No accessory muscle usage or respiratory distress.  Breath sounds: Normal breath sounds. No stridor. No wheezing, rhonchi or rales.     Comments: Reactive cough with deep breathing Neurological:     Mental Status: He is alert.  Psychiatric:        Behavior: Behavior is cooperative.      UC Treatments / Results  Labs (all labs ordered are listed, but only abnormal results are displayed) Labs Reviewed  CBC WITH DIFFERENTIAL/PLATELET  BASIC METABOLIC PANEL    EKG   Radiology DG Chest 2 View  Result Date: 06/26/2021 CLINICAL DATA:  cough EXAM: CHEST - 2 VIEW.  Slight rotation on frontal view. COMPARISON:  None Available. FINDINGS: The heart and mediastinal contours are within normal limits. Query vague retrocardiac airspace opacity. No pulmonary edema. No pleural effusion. No pneumothorax. No acute osseous abnormality. IMPRESSION: Query vague retrocardiac airspace opacity. Finding may be due to overlying vessels in the setting of mild rotation. Electronically Signed   By: Tish Frederickson M.D.   On: 06/26/2021 16:47    Procedures Procedures (including critical care time)  Medications Ordered in UC Medications  methylPREDNISolone acetate (DEPO-MEDROL) injection 60 mg (has no administration in time range)  albuterol (VENTOLIN HFA) 108 (90 Base) MCG/ACT inhaler 2 puff (2 puffs Inhalation Given 06/26/21 1600)    Initial Impression / Assessment and Plan / UC Course  I have reviewed the triage vital signs and the nursing notes.  Pertinent labs & imaging results that were available during my care of the patient were reviewed by me and  considered in my medical decision making (see chart for details).     No indication for viral testing as patient has been symptomatic for more than 5 days and this would not change management.  X-ray was obtained that showed no evidence of consolidation.  No evidence of acute infection on physical exam that would warrant initiation of antibiotics.  Patient was given 60 mg of Depo-Medrol per his request.  He was also prescribed prednisone burst of 20 mg for 3 days with instruction not to take NSAIDs with this medication.  Can use Tylenol, Mucinex, Flonase for additional symptom relief.  He was prescribed meclizine DM for cough.  Discussed that this can be sedating and he should not drive or drink alcohol with taking it.  He was given albuterol in clinic with improvement of cough symptoms.  Recommend he uses every 4-6 hours as needed.  Discussed that if his symptoms or not improving within a week he should return for reevaluation.  If at any point he has worsening symptoms including fever, worsening cough, shortness of breath, chest pain, nausea/vomiting he needs to be seen immediately to which he expressed understanding.  Blood pressure is elevated today.  This is likely because patient is without his medication.  He denies any signs/symptoms of endorgan damage.  Refill was provided.  CBC and BMP obtained today and were normal.  He is to avoid NSAIDs, caffeine, sodium.  Recommend he monitor his blood pressure at home and keep a log for evaluation of follow-up appointment.  He is to follow-up with his PCP as soon as possible.  Discussed that if he develops any chest pain, shortness of breath, headache, vision change, dizziness in setting of high blood pressure he needs to go to the emergency room to which he expressed understanding.  Final Clinical Impressions(s) / UC Diagnoses   Final diagnoses:  Bronchitis  Acute cough  Elevated blood pressure reading in office with diagnosis of hypertension  Discharge Instructions      Your x-ray did not show any evidence of pneumonia.  Did show some increased vasculature so I recommend that you follow-up with your primary care and have a repeated.  Use your albuterol inhaler every 4-6 hours.  Start prednisone 20 mg for 3 days.  Do not take NSAIDs including aspirin, ibuprofen/Advil, naproxen/Aleve with this medication.  You can use Tylenol, Mucinex, Flonase for symptom relief.  Use Promethazine DM for cough.  This will make you sleepy so do not drive or drink alcohol with taking it.  If anything worsens you need to be seen immediately.  Your blood pressure is very elevated.  Please start your blood pressure medication back.  Follow-up with your PCP.  Monitor this at home.  Avoid NSAIDs including aspirin, ibuprofen/Advil, naproxen/Aleve, caffeine, sodium.  If you develop any chest pain, shortness of breath, headache, vision change in the setting of high blood pressure you need to go to the emergency room as we discussed.     ED Prescriptions     Medication Sig Dispense Auth. Provider   triamterene-hydrochlorothiazide (DYAZIDE) 37.5-25 MG capsule Take 1 each (1 capsule total) by mouth daily. 90 capsule Alicja Everitt K, PA-C   predniSONE (DELTASONE) 20 MG tablet Take 1 tablet (20 mg total) by mouth daily. 3 tablet Edia Pursifull K, PA-C   promethazine-dextromethorphan (PROMETHAZINE-DM) 6.25-15 MG/5ML syrup Take 5 mLs by mouth 3 (three) times daily as needed for cough. 118 mL Derinda Bartus K, PA-C      PDMP not reviewed this encounter.   Jeani Hawking, PA-C 06/26/21 1713    Geovannie Vilar, Noberto Retort, PA-C 06/26/21 1717

## 2021-06-26 NOTE — ED Triage Notes (Signed)
T reports he had had a cough since Tuesday.

## 2021-07-19 ENCOUNTER — Encounter: Payer: Self-pay | Admitting: Internal Medicine

## 2021-07-19 ENCOUNTER — Ambulatory Visit (INDEPENDENT_AMBULATORY_CARE_PROVIDER_SITE_OTHER): Payer: BC Managed Care – PPO | Admitting: Internal Medicine

## 2021-07-19 VITALS — BP 146/88 | HR 95 | Temp 98.6°F | Ht 67.0 in | Wt 293.4 lb

## 2021-07-19 DIAGNOSIS — E785 Hyperlipidemia, unspecified: Secondary | ICD-10-CM | POA: Diagnosis not present

## 2021-07-19 DIAGNOSIS — Z0001 Encounter for general adult medical examination with abnormal findings: Secondary | ICD-10-CM | POA: Diagnosis not present

## 2021-07-19 DIAGNOSIS — I10 Essential (primary) hypertension: Secondary | ICD-10-CM | POA: Diagnosis not present

## 2021-07-19 DIAGNOSIS — E559 Vitamin D deficiency, unspecified: Secondary | ICD-10-CM | POA: Diagnosis not present

## 2021-07-19 DIAGNOSIS — Z1211 Encounter for screening for malignant neoplasm of colon: Secondary | ICD-10-CM

## 2021-07-19 LAB — LIPID PANEL
Cholesterol: 243 mg/dL — ABNORMAL HIGH (ref 0–200)
HDL: 37.2 mg/dL — ABNORMAL LOW (ref >39.00)
LDL Cholesterol: 176 mg/dL — ABNORMAL HIGH (ref 0–99)
NonHDL: 205.41
Total CHOL/HDL Ratio: 7
Triglycerides: 145 mg/dL (ref 0.0–149.0)
VLDL: 29 mg/dL (ref 0.0–40.0)

## 2021-07-19 LAB — URINALYSIS, ROUTINE W REFLEX MICROSCOPIC
Bilirubin Urine: NEGATIVE
Hgb urine dipstick: NEGATIVE
Ketones, ur: NEGATIVE
Leukocytes,Ua: NEGATIVE
Nitrite: NEGATIVE
Specific Gravity, Urine: 1.025 (ref 1.000–1.030)
Total Protein, Urine: NEGATIVE
Urine Glucose: NEGATIVE
Urobilinogen, UA: 0.2 (ref 0.0–1.0)
pH: 6 (ref 5.0–8.0)

## 2021-07-19 LAB — HEPATIC FUNCTION PANEL
ALT: 20 U/L (ref 0–53)
AST: 17 U/L (ref 0–37)
Albumin: 4.9 g/dL (ref 3.5–5.2)
Alkaline Phosphatase: 60 U/L (ref 39–117)
Bilirubin, Direct: 0.1 mg/dL (ref 0.0–0.3)
Total Bilirubin: 0.7 mg/dL (ref 0.2–1.2)
Total Protein: 8.5 g/dL — ABNORMAL HIGH (ref 6.0–8.3)

## 2021-07-19 LAB — VITAMIN D 25 HYDROXY (VIT D DEFICIENCY, FRACTURES): VITD: 26.42 ng/mL — ABNORMAL LOW (ref 30.00–100.00)

## 2021-07-19 LAB — TSH: TSH: 3.23 u[IU]/mL (ref 0.35–5.50)

## 2021-07-19 LAB — HEMOGLOBIN A1C: Hgb A1c MFr Bld: 5.3 % (ref 4.6–6.5)

## 2021-07-19 LAB — PSA: PSA: 1.56 ng/mL (ref 0.10–4.00)

## 2021-07-19 MED ORDER — ROSUVASTATIN CALCIUM 20 MG PO TABS
20.0000 mg | ORAL_TABLET | Freq: Every day | ORAL | 1 refills | Status: DC
  Filled 2022-02-03: qty 90, 90d supply, fill #0

## 2021-07-19 MED ORDER — SEMAGLUTIDE-WEIGHT MANAGEMENT 0.25 MG/0.5ML ~~LOC~~ SOAJ: 0.2500 mg | SUBCUTANEOUS | 0 refills | Status: AC

## 2021-07-19 MED ORDER — SEMAGLUTIDE-WEIGHT MANAGEMENT 1.7 MG/0.75ML ~~LOC~~ SOAJ: 1.7000 mg | SUBCUTANEOUS | 0 refills | Status: AC

## 2021-07-19 MED ORDER — SEMAGLUTIDE-WEIGHT MANAGEMENT 1 MG/0.5ML ~~LOC~~ SOAJ: 1.0000 mg | SUBCUTANEOUS | 0 refills | Status: AC

## 2021-07-19 MED ORDER — SEMAGLUTIDE-WEIGHT MANAGEMENT 0.5 MG/0.5ML ~~LOC~~ SOAJ: 0.5000 mg | SUBCUTANEOUS | 0 refills | Status: AC

## 2021-07-19 MED ORDER — SEMAGLUTIDE-WEIGHT MANAGEMENT 2.4 MG/0.75ML ~~LOC~~ SOAJ: 2.4000 mg | SUBCUTANEOUS | 0 refills | Status: AC

## 2021-07-19 MED ORDER — CHOLECALCIFEROL 1.25 MG (50000 UT) PO CAPS: 50000.0000 [IU] | ORAL_CAPSULE | ORAL | 0 refills | Status: AC

## 2021-07-19 NOTE — Patient Instructions (Signed)
Health Maintenance, Male Adopting a healthy lifestyle and getting preventive care are important in promoting health and wellness. Ask your health care provider about: The right schedule for you to have regular tests and exams. Things you can do on your own to prevent diseases and keep yourself healthy. What should I know about diet, weight, and exercise? Eat a healthy diet  Eat a diet that includes plenty of vegetables, fruits, low-fat dairy products, and lean protein. Do not eat a lot of foods that are high in solid fats, added sugars, or sodium. Maintain a healthy weight Body mass index (BMI) is a measurement that can be used to identify possible weight problems. It estimates body fat based on height and weight. Your health care provider can help determine your BMI and help you achieve or maintain a healthy weight. Get regular exercise Get regular exercise. This is one of the most important things you can do for your health. Most adults should: Exercise for at least 150 minutes each week. The exercise should increase your heart rate and make you sweat (moderate-intensity exercise). Do strengthening exercises at least twice a week. This is in addition to the moderate-intensity exercise. Spend less time sitting. Even light physical activity can be beneficial. Watch cholesterol and blood lipids Have your blood tested for lipids and cholesterol at 46 years of age, then have this test every 5 years. You may need to have your cholesterol levels checked more often if: Your lipid or cholesterol levels are high. You are older than 46 years of age. You are at high risk for heart disease. What should I know about cancer screening? Many types of cancers can be detected early and may often be prevented. Depending on your health history and family history, you may need to have cancer screening at various ages. This may include screening for: Colorectal cancer. Prostate cancer. Skin cancer. Lung  cancer. What should I know about heart disease, diabetes, and high blood pressure? Blood pressure and heart disease High blood pressure causes heart disease and increases the risk of stroke. This is more likely to develop in people who have high blood pressure readings or are overweight. Talk with your health care provider about your target blood pressure readings. Have your blood pressure checked: Every 3-5 years if you are 18-39 years of age. Every year if you are 40 years old or older. If you are between the ages of 65 and 75 and are a current or former smoker, ask your health care provider if you should have a one-time screening for abdominal aortic aneurysm (AAA). Diabetes Have regular diabetes screenings. This checks your fasting blood sugar level. Have the screening done: Once every three years after age 45 if you are at a normal weight and have a low risk for diabetes. More often and at a younger age if you are overweight or have a high risk for diabetes. What should I know about preventing infection? Hepatitis B If you have a higher risk for hepatitis B, you should be screened for this virus. Talk with your health care provider to find out if you are at risk for hepatitis B infection. Hepatitis C Blood testing is recommended for: Everyone born from 1945 through 1965. Anyone with known risk factors for hepatitis C. Sexually transmitted infections (STIs) You should be screened each year for STIs, including gonorrhea and chlamydia, if: You are sexually active and are younger than 46 years of age. You are older than 46 years of age and your   health care provider tells you that you are at risk for this type of infection. Your sexual activity has changed since you were last screened, and you are at increased risk for chlamydia or gonorrhea. Ask your health care provider if you are at risk. Ask your health care provider about whether you are at high risk for HIV. Your health care provider  may recommend a prescription medicine to help prevent HIV infection. If you choose to take medicine to prevent HIV, you should first get tested for HIV. You should then be tested every 3 months for as long as you are taking the medicine. Follow these instructions at home: Alcohol use Do not drink alcohol if your health care provider tells you not to drink. If you drink alcohol: Limit how much you have to 0-2 drinks a day. Know how much alcohol is in your drink. In the [place] one drink equals one 12 oz bottle of beer (355 mL), one 5 oz glass of wine (148 mL), or one 1 oz glass of hard liquor (44 mL). Lifestyle Do not use any products that contain nicotine or tobacco. These products include cigarettes, chewing tobacco, and vaping devices, such as e-cigarettes. If you need help quitting, ask your health care provider. Do not use street drugs. Do not share needles. Ask your health care provider for help if you need support or information about quitting drugs. General instructions Schedule regular health, dental, and eye exams. Stay current with your vaccines. Tell your health care provider if: You often feel depressed. You have ever been abused or do not feel safe at home. Summary Adopting a healthy lifestyle and getting preventive care are important in promoting health and wellness. Follow your health care provider's instructions about healthy diet, exercising, and getting tested or screened for diseases. Follow your health care provider's instructions on monitoring your cholesterol and blood pressure. This information is not intended to replace advice given to you by your health care provider. Make sure you discuss any questions you have with your health care provider. Document Revised: 05/18/2020 Document Reviewed: 05/18/2020 [employer] Patient Education  2023 [employer]  

## 2021-07-19 NOTE — Progress Notes (Signed)
Subjective:  Patient ID: Marc Bradley, male    DOB: 03/16/1975  Age: 46 y.o. MRN: [id number]  CC: Annual Exam, Hypertension, and Hyperlipidemia   HPI Karson Reede presents for a CPX and f/up -   He has had a few headaches recently but denies blurred vision, chest pain, shortness of breath, or edema.  He wants to use Wegov[employer] to help him lose weight.  Outpatient Medications Prior to Visit  Medication Sig Dispense Refill   sildenafil (VIAGRA) 100 MG tablet Take 1 tablet (100 mg total) by mouth daily as needed for erectile dysfunction. 10 tablet 11   triamterene-hydrochlorothiazide (DYAZIDE) 37.5-25 MG capsule Take 1 each (1 capsule total) by mouth daily. 90 capsule 0   Cholecalciferol 1.25 MG (50000 UT) capsule Take 1 capsule (50,000 Units total) by mouth once a week. 12 capsule 1   predniSONE (DELTASONE) 20 MG tablet Take 1 tablet (20 mg total) by mouth daily. 3 tablet 0   promethazine-dextromethorphan (PROMETHAZINE-DM) 6.25-15 MG/5ML syrup Take 5 mLs by mouth 3 (three) times daily as needed for cough. 118 mL 0   rosuvastatin (CRESTOR) 20 MG tablet Take 1 tablet (20 mg total) by mouth daily. 90 tablet 1   No facility-administered medications prior to visit.    ROS Review of Systems  Constitutional:  Negative for chills, diaphoresis, fatigue and fever.  HENT: Negative.    Eyes: Negative.   Respiratory:  Negative for cough, chest tightness, shortness of breath and wheezing.   Cardiovascular:  Negative for chest pain, palpitations and leg swelling.  Gastrointestinal:  Negative for abdominal pain, constipation, diarrhea, nausea and vomiting.  Endocrine: Negative.   Genitourinary: Negative.  Negative for difficulty urinating and hematuria.  Musculoskeletal: Negative.  Negative for arthralgias and myalgias.  Skin: Negative.   Neurological:  Negative for dizziness, weakness and headaches.  Hematological:  Negative for adenopathy. Does not bruise/bleed easily.   Psychiatric/Behavioral: Negative.      Objective:  BP (!) 146/88   Pulse 95   Temp 98.6 F (37 C) (Oral)   Ht 5\' 7"  (1.702 m)   Wt 293 lb 6 oz (133.1 kg)   SpO2 93%   BMI 45.95 kg/m   BP Readings from Last 3 Encounters:  07/19/21 (!) 146/88  06/26/21 (!) 178/116  04/12/21 (!) 161/104    Wt Readings from Last 3 Encounters:  07/19/21 293 lb 6 oz (133.1 kg)  04/12/21 (!) 309 lb 8 oz (140.4 kg)  09/24/20 (!) 301 lb (136.5 kg)    Physical Exam Vitals reviewed.  Constitutional:      Appearance: He is obese. He is not ill-appearing.  HENT:     Mouth/Throat:     Mouth: Mucous membranes are moist.  Eyes:     General: No scleral icterus.    Conjunctiva/sclera: Conjunctivae normal.  Cardiovascular:     Rate and Rhythm: Normal rate and regular rhythm.     Heart sounds: Normal heart sounds, S1 normal and S2 normal. No murmur heard.    Comments: EKG- NSR, 92 bpm Inf/lateral flat T waves No LVH or Q waves Pulmonary:     Effort: Pulmonary effort is normal.     Breath sounds: No stridor. No wheezing, rhonchi or rales.  Abdominal:     General: Abdomen is flat.     Palpations: There is no mass.     Tenderness: There is no abdominal tenderness. There is no guarding.     Hernia: No hernia is present.  Musculoskeletal:  Cervical back: Neck supple.     Right lower leg: No edema.     Left lower leg: No edema.  Skin:    General: Skin is warm and dry.  Neurological:     General: No focal deficit present.     Mental Status: He is alert.  Psychiatric:        Mood and Affect: Mood normal.        Behavior: Behavior normal.     Lab Results  Component Value Date   WBC 5.3 06/26/2021   HGB 13.4 06/26/2021   HCT 41.5 06/26/2021   PLT 312 06/26/2021   GLUCOSE 97 06/26/2021   CHOL 243 (H) 07/19/2021   TRIG 145.0 07/19/2021   HDL 37.20 (L) 07/19/2021   LDLDIRECT 153.0 06/29/2020   LDLCALC 176 (H) 07/19/2021   ALT 20 07/19/2021   AST 17 07/19/2021   NA 139 06/26/2021    K 3.7 06/26/2021   CL 106 06/26/2021   CREATININE 1.24 06/26/2021   BUN 10 06/26/2021   CO2 24 06/26/2021   TSH 3.23 07/19/2021   PSA 1.56 07/19/2021   HGBA1C 5.3 07/19/2021    DG Chest 2 View  Result Date: 06/26/2021 CLINICAL DATA:  cough EXAM: CHEST - 2 VIEW.  Slight rotation on frontal view. COMPARISON:  None Available. FINDINGS: The heart and mediastinal contours are within normal limits. Query vague retrocardiac airspace opacity. No pulmonary edema. No pleural effusion. No pneumothorax. No acute osseous abnormality. IMPRESSION: Query vague retrocardiac airspace opacity. Finding may be due to overlying vessels in the setting of mild rotation. Electronically Signed   By: Iven Finn M.D.   On: 06/26/2021 16:47    Assessment & Plan:   Gehrig was seen today for annual exam, hypertension and hyperlipidemia.  Diagnoses and all orders for this visit:  Hyperlipidemia with target LDL less than 130- I have asked him to take a statin for cardiovascular risk reduction. -     Lipid panel; Future -     Hepatic function panel; Future -     PSA; Future -     PSA -     Hepatic function panel -     Lipid panel -     rosuvastatin (CRESTOR) 20 MG tablet; Take 1 tablet (20 mg total) by mouth daily.  Encounter for general adult medical examination with abnormal findings- Exam completed, labs reviewed, vaccines are up-to-date, cancer screenings addressed, patient education was given.  Primary hypertension- His blood pressure is not adequately well controlled.  He will improve his lifestyle modifications. -     Cancel: Basic metabolic panel; Future -     Aldosterone + renin activity w/ ratio; Future -     TSH; Future -     Urinalysis, Routine w reflex microscopic; Future -     Cancel: CBC with Differential/Platelet; Future -     EKG 12-Lead -     Urinalysis, Routine w reflex microscopic -     TSH -     Aldosterone + renin activity w/ ratio  Morbid obesity ([place])- Labs are negative for  secondary causes or complications. -     Hemoglobin A1c; Future -     TSH; Future -     EKG 12-Lead -     TSH -     Hemoglobin A1c -     Semaglutide-Weight Management 0.25 MG/0.5ML SOAJ; Inject 0.25 mg into the skin once a week for 28 days. -     Semaglutide-Weight Management 0.5  MG/0.5ML SOAJ; Inject 0.5 mg into the skin once a week for 28 days. -     Semaglutide-Weight Management 1 MG/0.5ML SOAJ; Inject 1 mg into the skin once a week for 28 days. -     Semaglutide-Weight Management 1.7 MG/0.75ML SOAJ; Inject 1.7 mg into the skin once a week for 28 days. -     Semaglutide-Weight Management 2.4 MG/0.75ML SOAJ; Inject 2.4 mg into the skin once a week for 28 days.  Vitamin D deficiency disease -     VITAMIN D 25 Hydroxy (Vit-D Deficiency, Fractures); Future -     VITAMIN D 25 Hydroxy (Vit-D Deficiency, Fractures) -     Cholecalciferol 1.25 MG (50000 UT) capsule; Take 1 capsule (50,000 Units total) by mouth once a week.  Screen for colon cancer -     Ambulatory referral to Gastroenterology   I have discontinued Caryn Bee C. Gauntt's predniSONE and promethazine-dextromethorphan. I am also having him start on Semaglutide-Weight Management, Semaglutide-Weight Management, Semaglutide-Weight Management, Semaglutide-Weight Management, and Semaglutide-Weight Management. Additionally, I am having him maintain his sildenafil, triamterene-hydrochlorothiazide, Cholecalciferol, and rosuvastatin.  Meds ordered this encounter  Medications   Semaglutide-Weight Management 0.25 MG/0.5ML SOAJ    Sig: Inject 0.25 mg into the skin once a week for 28 days.    Dispense:  2 mL    Refill:  0   Semaglutide-Weight Management 0.5 MG/0.5ML SOAJ    Sig: Inject 0.5 mg into the skin once a week for 28 days.    Dispense:  2 mL    Refill:  0   Semaglutide-Weight Management 1 MG/0.5ML SOAJ    Sig: Inject 1 mg into the skin once a week for 28 days.    Dispense:  2 mL    Refill:  0   Semaglutide-Weight Management 1.7  MG/0.75ML SOAJ    Sig: Inject 1.7 mg into the skin once a week for 28 days.    Dispense:  3 mL    Refill:  0   Semaglutide-Weight Management 2.4 MG/0.75ML SOAJ    Sig: Inject 2.4 mg into the skin once a week for 28 days.    Dispense:  3 mL    Refill:  0   Cholecalciferol 1.25 MG (50000 UT) capsule    Sig: Take 1 capsule (50,000 Units total) by mouth once a week.    Dispense:  12 capsule    Refill:  0   rosuvastatin (CRESTOR) 20 MG tablet    Sig: Take 1 tablet (20 mg total) by mouth daily.    Dispense:  90 tablet    Refill:  1     Follow-up: Return in about 6 months (around 01/19/2022).  Sanda Linger, MD

## 2021-07-20 ENCOUNTER — Telehealth: Payer: Self-pay | Admitting: *Deleted

## 2021-07-20 NOTE — Telephone Encounter (Signed)
Pt was on cover-my-meds need PA for [facility]. Submitted PA w/ (Key: [id number]). Waiting on insurance response..Gordan Payment

## 2021-07-21 NOTE — Telephone Encounter (Signed)
Rec'd determination fax med was APPROVED. It states This request has received an approval. Effective 07/21/2021 - 02/21/2022. Faxing approval to pof...Raechel Chute

## 2021-07-26 LAB — ALDOSTERONE + RENIN ACTIVITY W/ RATIO
ALDO / PRA Ratio: 2.5 Ratio (ref 0.9–28.9)
Aldosterone: 14 ng/dL
Renin Activity: 5.56 ng/mL/h (ref 0.25–5.82)

## 2021-09-20 ENCOUNTER — Telehealth: Payer: Self-pay | Admitting: Family Medicine

## 2021-09-20 NOTE — Telephone Encounter (Signed)
Called pt, unable to leave VM due to VM box being full. Sent mychart msg and appointment reminder in mail letting pt know of appointment change from 04/14/22 to 04/11/22 -NP out

## 2021-12-14 ENCOUNTER — Telehealth: Payer: Self-pay | Admitting: Internal Medicine

## 2021-12-14 NOTE — Telephone Encounter (Signed)
Patient called and would like to change his wygovey to [facility] - Please call patient and let him know.  Pharmacy:  [facility] [address] [place]:  [phone]

## 2021-12-15 ENCOUNTER — Other Ambulatory Visit: Payer: Self-pay | Admitting: Internal Medicine

## 2021-12-20 NOTE — Telephone Encounter (Signed)
Called pt, LVM stating change unable to be done due to him not having a Dx of DM.

## 2022-02-02 ENCOUNTER — Other Ambulatory Visit (HOSPITAL_COMMUNITY): Payer: Self-pay

## 2022-02-03 ENCOUNTER — Other Ambulatory Visit (HOSPITAL_COMMUNITY): Payer: Self-pay

## 2022-02-03 MED ORDER — LOSARTAN POTASSIUM 50 MG PO TABS
50.0000 mg | ORAL_TABLET | Freq: Every day | ORAL | 3 refills | Status: AC
  Filled 2022-02-03: qty 30, 30d supply, fill #0

## 2022-02-03 MED ORDER — AMLODIPINE BESYLATE 5 MG PO TABS
5.0000 mg | ORAL_TABLET | Freq: Every day | ORAL | 3 refills | Status: AC
  Filled 2022-02-03: qty 30, 30d supply, fill #0

## 2022-02-03 MED ORDER — SEMAGLUTIDE-WEIGHT MANAGEMENT 0.25 MG/0.5ML ~~LOC~~ SOAJ
0.2500 mg | SUBCUTANEOUS | 0 refills | Status: AC
  Filled 2022-02-03: qty 2, 28d supply, fill #0

## 2022-02-04 ENCOUNTER — Other Ambulatory Visit (HOSPITAL_COMMUNITY): Payer: Self-pay

## 2022-02-07 ENCOUNTER — Other Ambulatory Visit (HOSPITAL_COMMUNITY): Payer: Self-pay

## 2022-02-07 MED ORDER — LOSARTAN POTASSIUM 50 MG PO TABS
50.0000 mg | ORAL_TABLET | Freq: Every day | ORAL | 3 refills | Status: AC
  Filled 2022-02-07: qty 30, 30d supply, fill #0

## 2022-02-07 MED ORDER — SEMAGLUTIDE-WEIGHT MANAGEMENT 2.4 MG/0.75ML ~~LOC~~ SOAJ
2.4000 mg | SUBCUTANEOUS | 0 refills | Status: AC
  Filled 2022-02-07: qty 3, 28d supply, fill #0

## 2022-02-07 MED ORDER — AMLODIPINE BESYLATE 10 MG PO TABS
10.0000 mg | ORAL_TABLET | Freq: Every day | ORAL | 3 refills | Status: AC
Start: 2022-02-06 — End: ?
  Filled 2022-02-07: qty 30, 30d supply, fill #0

## 2022-02-08 ENCOUNTER — Other Ambulatory Visit (HOSPITAL_COMMUNITY): Payer: Self-pay

## 2022-02-09 ENCOUNTER — Other Ambulatory Visit (HOSPITAL_COMMUNITY): Payer: Self-pay

## 2022-02-11 ENCOUNTER — Other Ambulatory Visit (HOSPITAL_COMMUNITY): Payer: Self-pay

## 2022-02-13 ENCOUNTER — Other Ambulatory Visit (HOSPITAL_COMMUNITY): Payer: Self-pay

## 2022-02-14 ENCOUNTER — Other Ambulatory Visit (HOSPITAL_COMMUNITY): Payer: Self-pay

## 2022-02-22 ENCOUNTER — Other Ambulatory Visit (HOSPITAL_COMMUNITY): Payer: Self-pay

## 2022-03-28 ENCOUNTER — Other Ambulatory Visit (HOSPITAL_COMMUNITY): Payer: Self-pay

## 2022-03-28 MED ORDER — ROSUVASTATIN CALCIUM 20 MG PO TABS: 20.0000 mg | ORAL_TABLET | Freq: Every evening | ORAL | 2 refills | Status: AC

## 2022-04-07 ENCOUNTER — Telehealth: Payer: Self-pay | Admitting: *Deleted

## 2022-04-07 NOTE — Telephone Encounter (Signed)
Left detailed message on patient voicemail to bring cpap machine and power cord to visit on 04/11/22.

## 2022-04-11 ENCOUNTER — Ambulatory Visit: Payer: BC Managed Care – PPO | Admitting: Family Medicine

## 2022-04-11 ENCOUNTER — Encounter: Payer: Self-pay | Admitting: Family Medicine

## 2022-04-11 NOTE — Progress Notes (Deleted)
PATIENT: Marc Bradley DOB: 07-Jul-1975  REASON FOR VISIT: follow up HISTORY FROM: patient  No chief complaint on file.    HISTORY OF PRESENT ILLNESS:  04/11/22 ALL:  Marc Bradley is a 47 y.o. male here today for follow up for OSA on CPAP.  He continues to do well on therapy. He is using CPAP nightly for about     HISTORY: (copied from Dr Dohmeier's previous note)  Marc Bradley is a 47 year old African American patient who is seen here In his first RV after a September 2022 HST to confirm his baseline apnea:  RV on 04/12/2021 :"his HST confirms the presence of severe sleep apnea complex in origin but vastly more obstructive than central events were noticed.  The overall apnea hypopnea index was 51.7/h,The hypnogram also appears to show fragmented sleep.   The patient is likely CPAP dependent - using currently an autotitration device with a 95% pressure at 16 cm water.  Hypoxia was noted- and we will need an ONO while on CPAP to confirm that no additional oxygen need remains.  RECOMMENDATION: New CPAP machine to be an Manufacturing systems engineer with a pressure setting between 7 and 19 cmH2O pressure and with 2 cm water pressure EPR, heated humidification and mask of patient's choice. Chief concern according to patient Internal referral for OSA, on CPAP. Pt had a ss and was dx with OSA, about 10 years ago. He has been using his cpap machine since then and has been doing well. Pt would like to try a different mask. He was evaluated for Migraines, chronic migraines, Dr Tomi Likens, DO."   He reports linking his new Auto device and using it compliantly, mask fits well and no condensation water is reported. His headaches are helped a lot by CPAP.  He endorses today the Epworth sleepiness score at 11 points, he does not feel fatigued.  His compliance report is excellent 97% for days and hours only 6 March which was a Monday was skipped.  He average uses CPAP at 6 hours 29 minutes, the  settings are as quoted above between 7 and 19 cmH2O there is no EPR and his regular 95th percentile pressure is 18.6 so he really straddles the upper settings.  His 95th percentile leak is minimal 7.3 L a minute is a very good fit, and his residual apneas are 0.4 hypopneas 1.4/h total AHI 1.8 which speaks for an excellent resolution of apnea. I like to offer a max setting of 20 cm water.    His main risk factor remains his weight, BMI over 45.    Sleep relevant medical history: Nocturia 2-3 times, Tonsillectomy in childhood, no TBI, Family medical /sleep history: NO other family member on CPAP with OSA, insomnia, sleep walkers.  Social history:  Patient is working as a Pharmacist, hospital , [employer], special ed. and lives in a household with spouse , no pets  alone. The patient currently works/ used to work in shifts( night/ rotating) Tobacco use- one a month cigar. ETOH use, on weekends - 3-4 , Caffeine intake in form of Coffee( /) Soda( /) Tea ( iced , in restaurant ) or energy drinks. Regular exercise - not regular Hobbies : none.    Sleep habits are as follows: The patient's dinner time is between 7-8.30 PM. The patient goes to bed at 22.30 hours and easily asleep- with CPAP - continues to sleep for 2-3 hours, wakes for 3 bathroom breaks,. The preferred sleep position is side, with  the support of 2 pillows. Dreams are reportedly frequent.  Has been woken by sharp cluster headaches.  5.30  AM is the usual rise time. The patient wakes up with an alarm.  He reports not feeling refreshed or restored in AM, with symptoms such as dry mouth, morning headaches, and residual fatigue.  Naps are taken infrequently, lasting from 30 to 60 minutes and are more refreshing than nocturnal sleep.    REVIEW OF SYSTEMS: Out of a complete 14 system review of symptoms, the patient complains only of the following symptoms, and all other reviewed systems are negative.  ESS: previously 11/24  ALLERGIES: No Known  Allergies  HOME MEDICATIONS: Outpatient Medications Prior to Visit  Medication Sig Dispense Refill   amLODipine (NORVASC) 10 MG tablet Take 1 tablet (10 mg total) by mouth daily. 30 tablet 3   amLODipine (NORVASC) 5 MG tablet take 1 tablet  by mouth daily 30 tablet 3   Cholecalciferol 1.25 MG (50000 UT) capsule Take 1 capsule (50,000 Units total) by mouth once a week. 12 capsule 0   losartan (COZAAR) 50 MG tablet take 1 tablet  by mouth daily 30 tablet 3   losartan (COZAAR) 50 MG tablet Take 1 tablet (50 mg total) by mouth daily. 30 tablet 3   rosuvastatin (CRESTOR) 20 MG tablet Take 1 tablet (20 mg total) by mouth every evening. 90 tablet 2   Semaglutide-Weight Management 0.25 MG/0.5ML SOAJ Inject 0.25 mg into the skin once a week. 2 mL 0   Semaglutide-Weight Management 2.4 MG/0.75ML SOAJ Inject 2.4 mg into the skin once a week. 3 mL 0   sildenafil (VIAGRA) 100 MG tablet Take 1 tablet (100 mg total) by mouth daily as needed for erectile dysfunction. 10 tablet 11   triamterene-hydrochlorothiazide (DYAZIDE) 37.5-25 MG capsule Take 1 each (1 capsule total) by mouth daily. 90 capsule 0   No facility-administered medications prior to visit.    PAST MEDICAL HISTORY: Past Medical History:  Diagnosis Date   Hypertension    Migraine    Migraines     PAST SURGICAL HISTORY: Past Surgical History:  Procedure Laterality Date   CHOLECYSTECTOMY     SURGERY SCROTAL / TESTICULAR     age 71 per patient   TONSILLECTOMY     WISDOM TOOTH EXTRACTION      FAMILY HISTORY: Family History  Problem Relation Age of Onset   Cancer Father        blood - not sure what type   Hypertension Father    Diabetes Sister    Cancer Maternal Grandmother     SOCIAL HISTORY: Social History   Socioeconomic History   Marital status: Married    Spouse name: Tiffany   Number of children: 3   Years of education: Not on file   Highest education level: Master's degree (e.g., MA, MS, MEng, MEd, MSW, MBA)   Occupational History   Occupation: TECHNICIAN    Employer: [employer]  Tobacco Use   Smoking status: Some Days    Types: Cigars   Smokeless tobacco: Former  Substance and Sexual Activity   Alcohol use: Yes    Comment: social drinker, wine beed and cocktails   Drug use: Never   Sexual activity: Not on file  Other Topics Concern   Not on file  Social History Narrative   Patient is right-handed. He lives with his wife in a 2 story house, Restaurant manager, fast food on main floor. Drinks 2-3 sodas a day. He does not exercise.  Social Determinants of Health   Financial Resource Strain: Not on file  Food Insecurity: Not on file  Transportation Needs: Not on file  Physical Activity: Not on file  Stress: Not on file  Social Connections: Not on file  Intimate Partner Violence: Not on file     PHYSICAL EXAM  There were no vitals filed for this visit. There is no height or weight on file to calculate BMI.  Generalized: Well developed, in no acute distress  Cardiology: normal rate and rhythm, no murmur noted Respiratory: clear to auscultation bilaterally  Neurological examination  Mentation: Alert oriented to time, place, history taking. Follows all commands speech and language fluent Cranial nerve II-XII: Pupils were equal round reactive to light. Extraocular movements were full, visual field were full  Motor: The motor testing reveals 5 over 5 strength of all 4 extremities. Good symmetric motor tone is noted throughout.  Gait and station: Gait is normal.    DIAGNOSTIC DATA (LABS, IMAGING, TESTING) - I reviewed patient records, labs, notes, testing and imaging myself where available.      No data to display           Lab Results  Component Value Date   WBC 5.3 06/26/2021   HGB 13.4 06/26/2021   HCT 41.5 06/26/2021   MCV 93.5 06/26/2021   PLT 312 06/26/2021      Component Value Date/Time   NA 139 06/26/2021 1549   K 3.7 06/26/2021 1549   CL 106 06/26/2021 1549   CO2 24  06/26/2021 1549   GLUCOSE 97 06/26/2021 1549   BUN 10 06/26/2021 1549   CREATININE 1.24 06/26/2021 1549   CALCIUM 9.0 06/26/2021 1549   PROT 8.5 (H) 07/19/2021 1439   ALBUMIN 4.9 07/19/2021 1439   AST 17 07/19/2021 1439   ALT 20 07/19/2021 1439   ALKPHOS 60 07/19/2021 1439   BILITOT 0.7 07/19/2021 1439   GFRNONAA >60 06/26/2021 1549   Lab Results  Component Value Date   CHOL 243 (H) 07/19/2021   HDL 37.20 (L) 07/19/2021   LDLCALC 176 (H) 07/19/2021   LDLDIRECT 153.0 06/29/2020   TRIG 145.0 07/19/2021   CHOLHDL 7 07/19/2021   Lab Results  Component Value Date   HGBA1C 5.3 07/19/2021   No results found for: "VITAMINB12" Lab Results  Component Value Date   TSH 3.23 07/19/2021     ASSESSMENT AND PLAN 47 y.o. year old male  has a past medical history of Hypertension, Migraine, and Migraines. here with   No diagnosis found.    Marc Bradley is doing well on CPAP therapy. Compliance report reveals ***. *** was encouraged to continue using CPAP nightly and for greater than 4 hours each night. We will update supply orders as indicated. Risks of untreated sleep apnea review and education materials provided. Healthy lifestyle habits encouraged. *** will follow up in ***, sooner if needed. *** verbalizes understanding and agreement with this plan.    No orders of the defined types were placed in this encounter.    No orders of the defined types were placed in this encounter.     Debbora Presto, FNP-C 04/11/2022, 8:13 AM [facility] [address] [place], [place] [address] [phone]

## 2022-04-14 ENCOUNTER — Ambulatory Visit: Payer: BC Managed Care – PPO | Admitting: Family Medicine

## 2022-04-25 ENCOUNTER — Other Ambulatory Visit (HOSPITAL_COMMUNITY): Payer: Self-pay

## 2022-07-27 ENCOUNTER — Other Ambulatory Visit (HOSPITAL_COMMUNITY): Payer: Self-pay

## 2022-07-27 MED ORDER — METHOCARBAMOL 500 MG PO TABS
500.0000 mg | ORAL_TABLET | Freq: Three times a day (TID) | ORAL | 0 refills | Status: AC
  Filled 2022-07-27: qty 45, 15d supply, fill #0

## 2022-07-27 MED ORDER — LOSARTAN POTASSIUM 50 MG PO TABS
50.0000 mg | ORAL_TABLET | Freq: Every day | ORAL | 3 refills | Status: AC
  Filled 2022-07-27: qty 30, 30d supply, fill #0

## 2022-07-27 MED ORDER — ROSUVASTATIN CALCIUM 20 MG PO TABS
20.0000 mg | ORAL_TABLET | Freq: Every day | ORAL | 2 refills | Status: AC
  Filled 2022-07-27: qty 90, 90d supply, fill #0

## 2022-07-27 MED ORDER — MELOXICAM 7.5 MG PO TABS
7.5000 mg | ORAL_TABLET | Freq: Every day | ORAL | 2 refills | Status: AC
  Filled 2022-07-27: qty 15, 15d supply, fill #0

## 2023-07-27 ENCOUNTER — Other Ambulatory Visit: Payer: Self-pay

## 2023-07-27 ENCOUNTER — Other Ambulatory Visit (HOSPITAL_COMMUNITY): Payer: Self-pay

## 2023-07-27 MED ORDER — ROSUVASTATIN CALCIUM 20 MG PO TABS
20.0000 mg | ORAL_TABLET | Freq: Every evening | ORAL | 0 refills | Status: AC
  Filled 2023-07-27: qty 90, 90d supply, fill #0

## 2023-07-27 MED ORDER — LOSARTAN POTASSIUM 50 MG PO TABS
50.0000 mg | ORAL_TABLET | Freq: Every day | ORAL | 0 refills | Status: AC
  Filled 2023-07-27: qty 90, 90d supply, fill #0

## 2023-08-08 ENCOUNTER — Other Ambulatory Visit (HOSPITAL_COMMUNITY): Payer: Self-pay

## 2023-08-09 ENCOUNTER — Other Ambulatory Visit (HOSPITAL_COMMUNITY): Payer: Self-pay

## 2023-08-09 ENCOUNTER — Other Ambulatory Visit: Payer: Self-pay

## 2023-08-09 MED ORDER — SILDENAFIL CITRATE 100 MG PO TABS
100.0000 mg | ORAL_TABLET | Freq: Every day | ORAL | 2 refills | Status: AC | PRN
  Filled 2023-08-09: qty 30, 30d supply, fill #0

## 2023-08-09 MED ORDER — ROSUVASTATIN CALCIUM 20 MG PO TABS: 20.0000 mg | ORAL_TABLET | Freq: Every evening | ORAL | 0 refills | Status: AC

## 2023-08-09 MED ORDER — ZEPBOUND 2.5 MG/0.5ML ~~LOC~~ SOAJ
2.5000 mg | SUBCUTANEOUS | 0 refills | Status: AC
  Filled 2023-08-09: qty 2, 28d supply, fill #0

## 2023-08-09 MED ORDER — LOSARTAN POTASSIUM 50 MG PO TABS: 50.0000 mg | ORAL_TABLET | Freq: Every day | ORAL | 0 refills | Status: AC

## 2023-08-21 ENCOUNTER — Other Ambulatory Visit (HOSPITAL_COMMUNITY): Payer: Self-pay

## 2024-02-11 IMAGING — DX DG CHEST 2V
2 series · 2 of 2 positions shown · non-contrast
Comparison: None Available.

CLINICAL DATA: cough

EXAM:
CHEST - 2 VIEW.  Slight rotation on frontal view.

[chest pa]
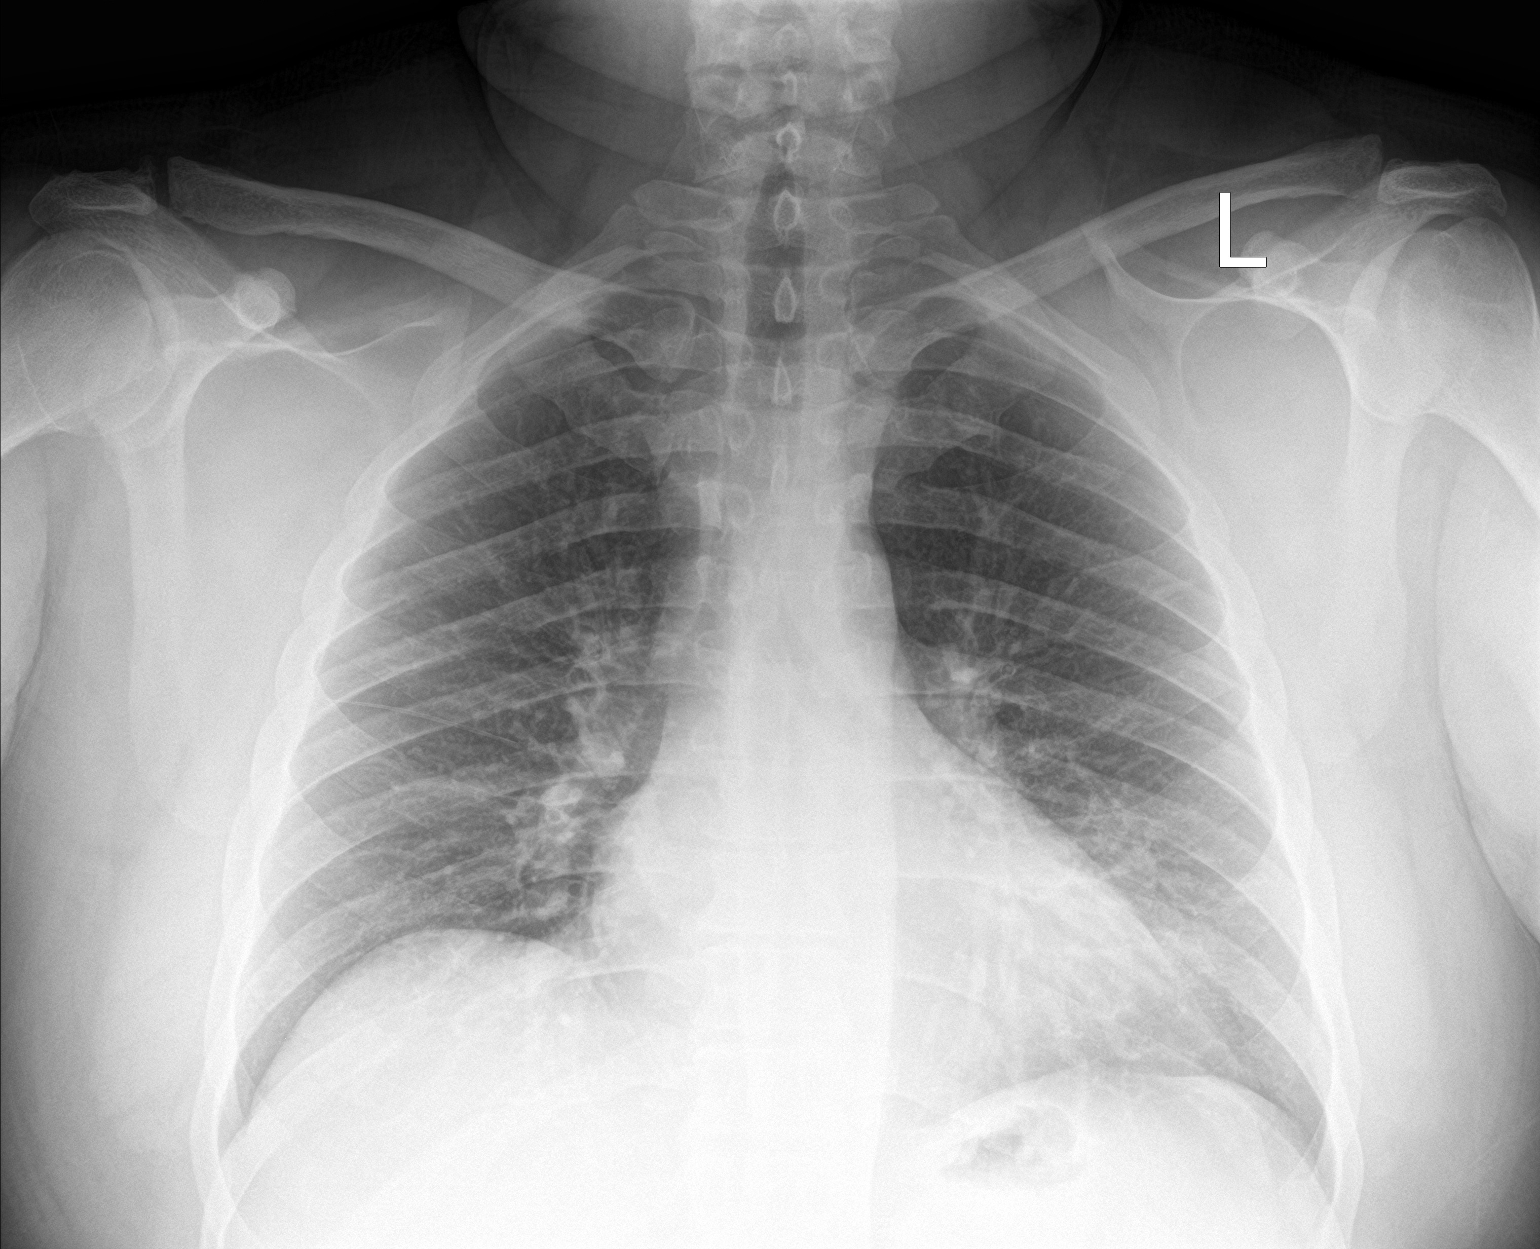

[chest lat]
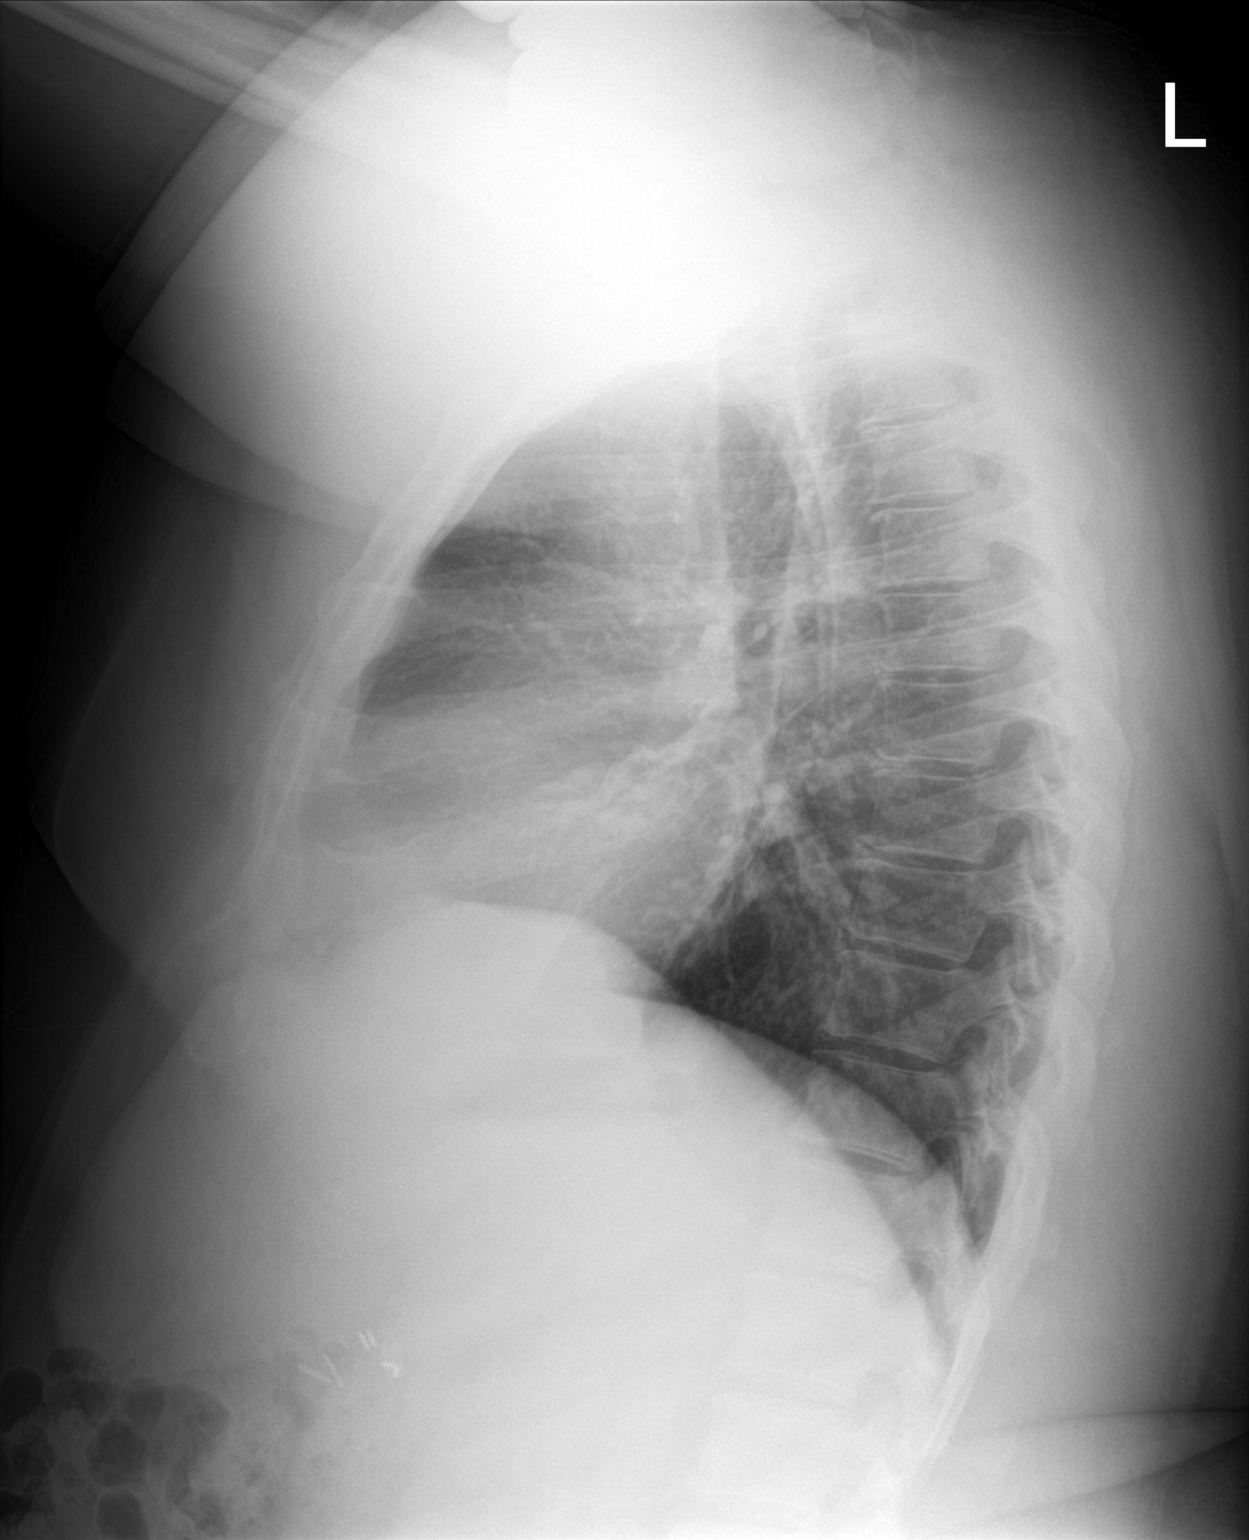

[2 of 2 positions shown; findings below may reference images not displayed]

FINDINGS: The heart and mediastinal contours are within normal limits.

Query vague retrocardiac airspace opacity. No pulmonary edema. No
pleural effusion. No pneumothorax.

No acute osseous abnormality.
IMPRESSION: Query vague retrocardiac airspace opacity. Finding may be due to
overlying vessels in the setting of mild rotation.
